# Patient Record
Sex: Male | Born: 1970 | Race: White | Hispanic: No | Marital: Married | Smoking: Current every day smoker
Health system: Southern US, Community
[De-identification: ages and names within clinical notes are randomized; demographics above are authoritative.]

## PROBLEM LIST (undated history)

## (undated) DIAGNOSIS — K559 Vascular disorder of intestine, unspecified: Secondary | ICD-10-CM

---

## 1998-12-11 ENCOUNTER — Encounter: Payer: Self-pay | Admitting: Emergency Medicine

## 1998-12-11 ENCOUNTER — Emergency Department (HOSPITAL_COMMUNITY): Admission: EM | Admit: 1998-12-11 | Discharge: 1998-12-11 | Payer: Self-pay | Admitting: Emergency Medicine

## 2001-04-30 ENCOUNTER — Encounter: Payer: Self-pay | Admitting: *Deleted

## 2001-04-30 ENCOUNTER — Encounter: Admission: RE | Admit: 2001-04-30 | Discharge: 2001-04-30 | Payer: Self-pay | Admitting: *Deleted

## 2001-05-04 ENCOUNTER — Encounter: Payer: Self-pay | Admitting: Emergency Medicine

## 2001-05-04 ENCOUNTER — Emergency Department (HOSPITAL_COMMUNITY): Admission: EM | Admit: 2001-05-04 | Discharge: 2001-05-04 | Payer: Self-pay | Admitting: Emergency Medicine

## 2001-06-02 ENCOUNTER — Encounter: Payer: Self-pay | Admitting: Emergency Medicine

## 2001-06-02 ENCOUNTER — Inpatient Hospital Stay (HOSPITAL_COMMUNITY): Admission: EM | Admit: 2001-06-02 | Discharge: 2001-06-05 | Payer: Self-pay | Admitting: Emergency Medicine

## 2001-06-16 ENCOUNTER — Encounter: Payer: Self-pay | Admitting: *Deleted

## 2001-06-16 ENCOUNTER — Encounter: Admission: RE | Admit: 2001-06-16 | Discharge: 2001-06-16 | Payer: Self-pay | Admitting: *Deleted

## 2001-07-29 ENCOUNTER — Ambulatory Visit (HOSPITAL_COMMUNITY): Admission: RE | Admit: 2001-07-29 | Discharge: 2001-07-29 | Payer: Self-pay | Admitting: Gastroenterology

## 2001-07-29 ENCOUNTER — Encounter (INDEPENDENT_AMBULATORY_CARE_PROVIDER_SITE_OTHER): Payer: Self-pay | Admitting: Specialist

## 2001-08-09 ENCOUNTER — Encounter: Payer: Self-pay | Admitting: *Deleted

## 2001-08-09 ENCOUNTER — Encounter: Admission: RE | Admit: 2001-08-09 | Discharge: 2001-08-09 | Payer: Self-pay | Admitting: *Deleted

## 2001-11-24 ENCOUNTER — Encounter: Admission: RE | Admit: 2001-11-24 | Discharge: 2001-11-24 | Payer: Self-pay | Admitting: Internal Medicine

## 2001-11-24 ENCOUNTER — Encounter: Payer: Self-pay | Admitting: Internal Medicine

## 2002-11-05 ENCOUNTER — Emergency Department (HOSPITAL_COMMUNITY): Admission: EM | Admit: 2002-11-05 | Discharge: 2002-11-05 | Payer: Self-pay | Admitting: Emergency Medicine

## 2003-07-03 ENCOUNTER — Encounter: Payer: Self-pay | Admitting: Internal Medicine

## 2003-07-03 ENCOUNTER — Encounter: Admission: RE | Admit: 2003-07-03 | Discharge: 2003-07-03 | Payer: Self-pay | Admitting: Internal Medicine

## 2004-04-10 ENCOUNTER — Emergency Department (HOSPITAL_COMMUNITY): Admission: EM | Admit: 2004-04-10 | Discharge: 2004-04-10 | Payer: Self-pay | Admitting: Emergency Medicine

## 2004-07-15 ENCOUNTER — Emergency Department (HOSPITAL_COMMUNITY): Admission: EM | Admit: 2004-07-15 | Discharge: 2004-07-16 | Payer: Self-pay | Admitting: Emergency Medicine

## 2005-05-24 ENCOUNTER — Emergency Department (HOSPITAL_COMMUNITY): Admission: EM | Admit: 2005-05-24 | Discharge: 2005-05-24 | Payer: Self-pay | Admitting: Emergency Medicine

## 2006-01-10 ENCOUNTER — Emergency Department (HOSPITAL_COMMUNITY): Admission: EM | Admit: 2006-01-10 | Discharge: 2006-01-10 | Payer: Self-pay | Admitting: Emergency Medicine

## 2007-01-16 ENCOUNTER — Emergency Department (HOSPITAL_COMMUNITY): Admission: EM | Admit: 2007-01-16 | Discharge: 2007-01-16 | Payer: Self-pay | Admitting: Emergency Medicine

## 2009-05-30 ENCOUNTER — Ambulatory Visit (HOSPITAL_BASED_OUTPATIENT_CLINIC_OR_DEPARTMENT_OTHER): Admission: RE | Admit: 2009-05-30 | Discharge: 2009-05-30 | Payer: Self-pay | Admitting: Orthopedic Surgery

## 2009-11-12 ENCOUNTER — Ambulatory Visit (HOSPITAL_BASED_OUTPATIENT_CLINIC_OR_DEPARTMENT_OTHER): Admission: RE | Admit: 2009-11-12 | Discharge: 2009-11-12 | Payer: Self-pay | Admitting: Orthopedic Surgery

## 2011-01-19 LAB — POCT HEMOGLOBIN-HEMACUE: Hemoglobin: 14.9 g/dL (ref 13.0–17.0)

## 2011-03-18 NOTE — Op Note (Signed)
Allen Larson, Allen Larson                 ACCOUNT NO.:  [id number]   MEDICAL RECORD NO.:  [id number]          PATIENT TYPE:  AMB   LOCATION:  DSC                          FACILITY:  [facility]   PHYSICIAN:  Feliberto Gottron. Turner Daniels, M.D.   DATE OF BIRTH:  05/19/1971   DATE OF PROCEDURE:  05/30/2009  DATE OF DISCHARGE:                               OPERATIVE REPORT   PREOPERATIVE DIAGNOSIS:  Right knee lateral meniscal tear.   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   PROCEDURE:  Right knee arthroscopic debridement of chondromalacia and  flap tears followed by microfracture of the posterior condylar region  that was grade 4 about 8 x 8 mm in size.   SURGEON:  Feliberto Gottron. Turner Daniels, MD   FIRST ASSISTANT:  None.   ANESTHETIC:  General LMA.   ESTIMATED BLOOD LOSS:  Minimal.   FLUID REPLACEMENT:  100 mL of crystalloid.   TOURNIQUET TIME:  None.   INDICATIONS FOR PROCEDURE:  A 40 year old gentleman who was bare for  water skiing, wiped out, and had a little bit of knee pain on the date  of the injury, but awoke yesterday morning with his knee locked and  unable to come to full extension.  His pain was more lateral than  medial.  He saw one of my partners Dr. Renae Fickle in the office who was  thought to have a displaced lateral meniscal tear and ultrasound  procedure was performed that was most consistent with a lateral meniscal  tear.  He had a 1 to 2+ effusion, had minimal pain relief with Xylocaine  injection and because his knee was locked, he is taken for arthroscopic  evaluation and treatment.  Risks and benefits of surgery were discussed  and questions were answered.   DESCRIPTION OF PROCEDURE:  The patient was identified by armband and  taken to operating room to [facility].  Appropriate  anesthetic monitors were attached and general LMA anesthesia induced  with  the patient in supine position.  Lateral post was applied to the  table and the right lower extremity was prepped and draped in the usual  sterile fashion from the ankle to the midthigh.  Time-out procedure  performed.  We began the operation itself by making standard  inferomedial and inferolateral peripatellar portals allowing  introduction of the arthroscope through the inferolateral portal and the  outflow through the inferomedial portal.  Pump pressure was kept between  70-90 mmHg and diagnostic arthroscopy revealed very mild grade 2 to 3  chondromalacia of the patella.  It was lightly debrided, moving into the  medial compartment distally and posteriorly along the notch region.  The  patient had fairly impressive flap tears of the articular cartilage that  was down to grade 4 and bare bone over an 8 x 8-mm area posteriorly.  The medial meniscus did have a small radial tear.  This was debrided and  this should be added to the postoperative diagnosis and  the procedure.  There was also noted that the ACL and PCL were intact.  The lateral  meniscus was in good condition and we were thoroughly probed from  posterior to lateral to anterior and the articular cartilage laterally  was in excellent condition.  The gutters were cleared medially and  laterally.  We directed our attention back to the posterior condyle of  the femur and I further debrided the area of grade 4 chondromalacia and  then brought in the microfracture picks and placed 6 punctate holes in  the bone in a fairly staggered pattern to hopefully bring a blood supply  to this area of bare bone chondromalacia.  The knee was irrigated out  with normal saline solution.  The arthroscopic instruments were removed  and a dressing of Xeroform, 4 x 4 dressing sponges, Webril, and Ace wrap  applied.  The patient was awakened and taken to the recovery room  without difficulty.      Feliberto Gottron. Turner Daniels, M.D.  Electronically Signed      FJR/MEDQ  D:  05/30/2009  T:  05/31/2009  Job:  [id number]

## 2011-03-21 NOTE — Consult Note (Signed)
[facility]  Patient:    Allen Larson, Allen Larson                        MRN: [id number] Proc. Date: 06/03/01 Adm. Date:  [id number] Attending:  Eliezer Champagne CC:         Wayne C. Dorna Bloom, M.D.   Consultation Report  HISTORY:  Patient seen at the request of Dr. Deniece Portela C. Wu for abdominal pain -- he has an essentially negative GI history, although an abdominal ultrasound was in the computer for abdominal pain a few years ago, which was negative -- who presents with a fairly acute onset of left upper quadrant abdominal pain. He went to the emergency room where CAT scan with stone protocol was done which did show two small kidney stones and possibly some inflammation in the descending colon and some sigmoid diverticula and he was admitted for further workup and plans.  His bowel movements have been normal without blood.  He has not had any previous other GI tests and has no urine complaints or blood in his urine.  According to Dr. Dorna Bloom, he has had some increased depression of late and had been started on Paxil recently.  PAST MEDICAL HISTORY:  Pertinent for panic attacks, shoulder surgery, some knee surgery and tonsillectomy.  FAMILY HISTORY:  Negative for any GI problems.  ALLERGIES:  No known allergies.  CURRENT MEDICINES:  Xanax and Paxil p.r.n.  SOCIAL HISTORY:  Denies any cocaine, speed, vitamins or herbs.  Will drink and does smoke.  REVIEW OF SYSTEMS:  Pertinent for he does not think the pain is any better but he has had no fever, chills or night sweats; no significant travel, at-risk meals or sick contacts.  PHYSICAL EXAMINATION:  GENERAL:  In no acute distress, lying comfortably in the bed.  VITAL SIGNS:  Stable.  Afebrile.  NECK:  Supple without obvious adenopathy.  LUNGS:  Clear.  HEART:  Regular rate and rhythm.  ABDOMEN:  He does have some left upper quadrant with guarding.  No rib cage tenderness.  No rebound tenderness.  Nontender elsewhere in the  abdomen.  EXTREMITIES:  Good peripheral pulses.  LABORATORY DATA:  Labs are pertinent for normal chemistries except for a total bilirubin of 1.4, not fractionated, normal other liver tests, BUN and creatinine.  Lipase normal.  CBC pertinent for being normal with a WBC of 9, which, after a day of antibiotics, has come down to 6.  CT reviewed with Dr. Ronaldo Miyamoto A. Young, pertinent for above-mentioned findings.  ASSESSMENT:  Probable diverticulitis.  PLAN:  Would treat for now for diverticulitis, change to p.o. antibiotics when better and can slowly advance his diet at that time.  If no better in one to two days or not responding, would repeat a CAT scan using contrast, since the first one was done without it.  May need to consider rectal contrast.  I have discussed the differential with the patient to include kidney stones, other infections, etc.  I discussed the risks, benefits and methods of colonoscopy with the patient and I probably would proceed as long as he responds appropriately for diverticulitis in two weeks as an outpatient, just to confirm the diagnosis.  We discussed proceeding too soon with too much inflammation increases the risks.  Might consider a GU consult if there is still a question of kidney stones causing his problems, which did show up on the CAT scan.  I will recheck tomorrow.  Thank  you much for the consultation and call me sooner p.r.n. DD:  06/03/01 TD:  06/03/01 Job: [id number]

## 2011-03-21 NOTE — Discharge Summary (Signed)
[facility]  Patient:    Allen Larson, Allen Larson                          MRN: [id number] Adm. Date:  06/02/01 Disc. Date: 06/05/01 Attending:  Yvette Rack. Dorna Bloom, M.D. CC:         Petra Kuba, M.D.   Discharge Summary  DISCHARGE CONDITION:  Stable.  DISCHARGE DIAGNOSES: 1. Left upper quadrant abdominal pain, likely diverticulitis. 2. Reflux disease. 3. Anxiety.  DISCHARGE MEDICATIONS: 1. Cipro 500 mg b.i.d. x 10 more days. 2. Flagyl 500 mg three times a day x 10 more days.  CONSULTS:  Petra Kuba, M.D., of gastroenterology.  FOLLOW-UP:  Follow up in approximately two weeks with Wayne C. Dorna Bloom, M.D., in the office, and also with Petra Kuba, M.D., for probable colonoscopy.  HOSPITAL COURSE:  Mr. Tith was admitted on June 02, 2001, after a one-day history of severe left flank and left lower quadrant abdominal pain.  He had initially a white count of 18,000, however, no shaking chills or fever. The patient had a CT scan done without contrast which showed two small kidney stones, but nonobstructing and inflamed left colon, which seemed to indicate diverticulitis.  The patient was begun on IV Cipro, Flagyl, IV fluids, and IV pain medicines.  The patient did improve during this time.  Petra Kuba, M.D., was consulted.  He will have a colonoscopy as an outpatient.  DISPOSITION:  The patient was discharged with the above medicines.  DISCHARGE CONDITION:  Stable. DD:  06/15/01 TD:  06/15/01 Job: [id number]

## 2011-03-21 NOTE — Procedures (Signed)
[facility]  Patient:    Allen Larson, Allen Larson Visit Number: [id number] MRN: [id number]          Service Type: END Location: ENDO Attending Physician:  Nelda Marseille Proc. Date: 07/29/01 Admit Date:  07/29/2001   CC:         Wayne C. Dorna Bloom, M.D.   Procedure Report  PROCEDURE:  Colonoscopy with biopsy.  INDICATIONS:  Patient with a bout of questionable diverticulitis.  Want to confirm the diagnosis.  INFORMED CONSENT:  Consent was signed after risk, benefits, methods and options were thoroughly discussed multiple times in the past.  MEDICINES USED:  Demerol 100 mg, Versed 10 mg.  DESCRIPTION OF PROCEDURE:  Rectal inspection was pertinent for external hemorrhoids.  Digital exam was negative.  The scope was inserted and easily advanced around the colon to the cecum.  This did not require any abdominal pressure or any position changes.  The cecum was identified by the appendiceal orifice and the ileocecal valve.  In fact, the scope was inserted a short ways into the terminal ileum which was normal.  Photodocumentation was obtained, but unfortunately the pictures did not print.  TI was normal, however.  The scope was slowly withdrawn.  The prep was adequate.  There was some liquid stool that required washing and suctioning.  On slow withdrawal through the colon, there were no signs of colitis, inflammation, ulceration, or even diverticula.  We slowly withdrew back to the distal sigmoid.  In the distal sigmoid, a tiny hyperplastic-appearing polyp was seen.  There was another one also seen in the rectum which was cold biopsied as well, so as the one in the sigmoid.  They were put in the same container.  The scope was retroflexed, revealing some internal hemorrhoids.  The scope was straightened and readvanced a short ways up the left side of the colon, air was suctioned and the scope removed.  The patient tolerated the procedure well, and there was  no obvious immediate complication.  ENDOSCOPIC DIAGNOSES: 1. Internal and external hemorrhoids. 2. Two tiny rectal and distal sigmoid polyps status post cold biopsied. 3. Otherwise within normal limits to the terminal ileum without signs of    diverticula or inflammation.  PLAN:  Await pathology to determine future colonic screening.  Follow up p.r.n. or in two months to recheck symptoms and make sure no further work-up plans are needed.  Happy to see back sooner p.r.n. Attending Physician:  Nelda Marseille DD:  07/29/01 TD:  07/29/01 Job: [id number]

## 2011-03-21 NOTE — Discharge Summary (Signed)
[facility]  Patient:    Allen Larson, Allen Larson                        MRN: [id number] Adm. Date:  [id number] Disc. Date: 06/05/01 Attending:  Eliezer Champagne CC:         Petra Kuba, M.D.   Discharge Summary  CONDITION ON DISCHARGE:  Good.  DISCHARGE MEDICATIONS: 1. Paxil CR 25 mg a day. 2. Cipro 500 mg b.i.d. x 10 days. 3. Flagyl 500 mg t.i.d. x 10 days. 4. Vicodin 5/500 1-2 every four to six hours p.r.n. pain. 5. Phenergan 25 mg 1 every four to six hours p.r.n. nausea or vomiting.  DIET:  To increase diet slowly.  Avoid seeds, nuts, raisins, corn, and popcorn.  FOLLOW-UP:  The patient is to see Dr. Ewing Schlein in two weeks and Dr. Dorna Bloom in one week.  DISCHARGE DIAGNOSES: 1. Left lower quadrant pain, likely diverticulitis. 2. Kidney stones, likely incidental. 3. Abdominal pain. 4. Depression. 5. Allergic rhinitis.  HISTORY OF PRESENT ILLNESS AND HOSPITAL COURSE:  Mr. Coppedge is a 40 year old white male with a history of mild depression who was admitted with two days of left lower quadrant abdominal pain.  No fever, diarrhea, or bowel changes.  No urinary symptoms.  CT scan initially on admission showed inflammatory edematous changes seen within the pericolic fat in addition to a segment of the descending colon.  Possibilities included diverticulitis and focal inflammatory colitis.  There was no evidence for hydronephrosis or ureteral calculi.  This was, however, was done without contrast.  There was also some sigmoid colonic diverticulosis seen.  Otherwise negative CT of the pelvis. The patient was initially begun on IV pain medicines including IV Phenergan and Dilaudid.  The patient never mounted an increased white count or fever.  He subsequently did improve during this time on IV Cipro and p.o. Flagyl.  Dr. Leary Roca of GI was consulted.  He felt this was likely a diverticulitis.  He thought that, in light of the acute changes, he would be best served with a  follow-up colonoscopy in two weeks to confirm diagnosis.  The patient did improve during this time and, hopefully, will be discharged on June 05, 2001, on p.o. antibiotics and pain medicines.  The patient is given the diet restrictions as above.  The patient will see Dr. Dorna Bloom in one week. DD:  06/04/01 TD:  06/05/01 Job: [id number]

## 2013-10-31 ENCOUNTER — Encounter (HOSPITAL_COMMUNITY): Payer: Self-pay | Admitting: Emergency Medicine

## 2013-10-31 ENCOUNTER — Emergency Department (HOSPITAL_COMMUNITY)
Admission: EM | Admit: 2013-10-31 | Discharge: 2013-11-01 | Disposition: A | Discharge status: Home / Self Care | Payer: BC Managed Care – PPO | Attending: Emergency Medicine | Admitting: Emergency Medicine

## 2013-10-31 DIAGNOSIS — Z8719 Personal history of other diseases of the digestive system: Secondary | ICD-10-CM | POA: Insufficient documentation

## 2013-10-31 DIAGNOSIS — R5381 Other malaise: Secondary | ICD-10-CM | POA: Insufficient documentation

## 2013-10-31 DIAGNOSIS — R52 Pain, unspecified: Secondary | ICD-10-CM | POA: Insufficient documentation

## 2013-10-31 DIAGNOSIS — R1084 Generalized abdominal pain: Secondary | ICD-10-CM | POA: Insufficient documentation

## 2013-10-31 DIAGNOSIS — R109 Unspecified abdominal pain: Secondary | ICD-10-CM

## 2013-10-31 DIAGNOSIS — F17200 Nicotine dependence, unspecified, uncomplicated: Secondary | ICD-10-CM | POA: Insufficient documentation

## 2013-10-31 HISTORY — DX: Vascular disorder of intestine, unspecified: K55.9

## 2013-10-31 LAB — COMPREHENSIVE METABOLIC PANEL
ALT: 26 U/L (ref 0–53)
Calcium: 9.2 mg/dL (ref 8.4–10.5)
Chloride: 102 mEq/L (ref 96–112)
Creatinine, Ser: 1.21 mg/dL (ref 0.50–1.35)
GFR calc non Af Amer: 73 mL/min — ABNORMAL LOW (ref >90)
Sodium: 139 mEq/L (ref 135–145)
Total Bilirubin: 0.4 mg/dL (ref 0.3–1.2)
Total Protein: 6.7 g/dL (ref 6.0–8.3)

## 2013-10-31 LAB — CBC WITH DIFFERENTIAL/PLATELET
Basophils Absolute: 0 10*3/uL (ref 0.0–0.1)
Basophils Relative: 0 % (ref 0–1)
HCT: 44.2 % (ref 39.0–52.0)
Hemoglobin: 15.3 g/dL (ref 13.0–17.0)
Lymphocytes Relative: 35 % (ref 12–46)
Lymphs Abs: 3.2 10*3/uL (ref 0.7–4.0)
MCH: 31.9 pg (ref 26.0–34.0)
Monocytes Absolute: 0.9 10*3/uL (ref 0.1–1.0)
Neutro Abs: 4.8 10*3/uL (ref 1.7–7.7)
Neutrophils Relative %: 53 % (ref 43–77)
RBC: 4.79 MIL/uL (ref 4.22–5.81)
RDW: 14.5 % (ref 11.5–15.5)

## 2013-10-31 MED ORDER — MORPHINE SULFATE 4 MG/ML IJ SOLN
4.0000 mg | Freq: Once | INTRAMUSCULAR | Status: AC
  Administered 2013-10-31: 4 mg via INTRAVENOUS
  Filled 2013-10-31: qty 1

## 2013-10-31 MED ORDER — SODIUM CHLORIDE 0.9 % IV BOLUS (SEPSIS)
1000.0000 mL | Freq: Once | INTRAVENOUS | Status: AC
  Administered 2013-10-31: 1000 mL via INTRAVENOUS

## 2013-10-31 MED ORDER — ONDANSETRON HCL 4 MG/2ML IJ SOLN
4.0000 mg | Freq: Once | INTRAMUSCULAR | Status: AC
  Administered 2013-10-31: 4 mg via INTRAVENOUS
  Filled 2013-10-31: qty 2

## 2013-10-31 NOTE — ED Notes (Signed)
Pt with history of ischemic colitis a year ago; same symptoms x 2 days; no bleeding

## 2013-11-01 ENCOUNTER — Emergency Department (HOSPITAL_COMMUNITY): Payer: BC Managed Care – PPO

## 2013-11-01 ENCOUNTER — Encounter (HOSPITAL_COMMUNITY): Payer: Self-pay

## 2013-11-01 LAB — URINALYSIS, ROUTINE W REFLEX MICROSCOPIC
Specific Gravity, Urine: 1.027 (ref 1.005–1.030)
pH: 6 (ref 5.0–8.0)

## 2013-11-01 MED ORDER — HYDROMORPHONE HCL PF 1 MG/ML IJ SOLN
0.5000 mg | Freq: Once | INTRAMUSCULAR | Status: AC
  Administered 2013-11-01: 0.5 mg via INTRAVENOUS

## 2013-11-01 MED ORDER — ONDANSETRON 4 MG PO TBDP: ORAL_TABLET | ORAL | Status: AC

## 2013-11-01 MED ORDER — HYDROMORPHONE HCL PF 1 MG/ML IJ SOLN
1.0000 mg | Freq: Once | INTRAMUSCULAR | Status: AC
  Administered 2013-11-01: 1 mg via INTRAVENOUS
  Filled 2013-11-01: qty 1

## 2013-11-01 MED ORDER — IOHEXOL 300 MG/ML  SOLN
50.0000 mL | Freq: Once | INTRAMUSCULAR | Status: AC | PRN
  Administered 2013-11-01: 50 mL via ORAL

## 2013-11-01 MED ORDER — HYDROMORPHONE HCL PF 2 MG/ML IJ SOLN
INTRAMUSCULAR | Status: AC
  Filled 2013-11-01: qty 1

## 2013-11-01 MED ORDER — SODIUM CHLORIDE 0.9 % IV BOLUS (SEPSIS)
1000.0000 mL | Freq: Once | INTRAVENOUS | Status: AC
  Administered 2013-11-01: 1000 mL via INTRAVENOUS

## 2013-11-01 MED ORDER — IOHEXOL 300 MG/ML  SOLN
100.0000 mL | Freq: Once | INTRAMUSCULAR | Status: AC | PRN
  Administered 2013-11-01: 100 mL via INTRAVENOUS

## 2013-11-01 MED ORDER — OXYCODONE-ACETAMINOPHEN 5-325 MG PO TABS: 1.0000 | ORAL_TABLET | Freq: Four times a day (QID) | ORAL | Status: DC | PRN

## 2013-11-01 NOTE — ED Provider Notes (Signed)
CSN: [id number]     Arrival date & time 10/31/13  1843 History   First MD Initiated Contact with Patient 10/31/13 2245     Chief Complaint  Patient presents with  . Abdominal Pain   (Consider location/radiation/quality/duration/timing/severity/associated sxs/prior Treatment) HPI 42 yo male presents with gradual onset of abdominal pain over the past 2 days. Patient states it feels like the same presentation as before when he was diagnosed with ischemic colitis. Patient states pain is constant at a 7/10 with intermittent sharp shooting pains rated at 9/10 pain is diffuse but worse in the lower abdomen. Pain is gradually worsening. Pain exacerbated with food, approximately 1-2 hours after eating. Patient appetite has been poor. Denies Fever/Chills. No chest pain or dyspnea. No N/V/D. Patient does admit to hard ball-like stools. Denies bloody stools or hematuria. Denies alcohol use or drug use. No hx of abdominal surgeries. Denies any medical conditions.  Past Medical History  Diagnosis Date  . Ischemic colitis    History reviewed. No pertinent past surgical history. No family history on file. History  Substance Use Topics  . Smoking status: Current Every Day Smoker -- 1.00 packs/day    Types: Cigarettes  . Smokeless tobacco: Not on file  . Alcohol Use: Not on file    Review of Systems  Constitutional: Positive for fatigue.  Gastrointestinal: Positive for abdominal pain. Negative for abdominal distention and rectal pain.  Genitourinary: Negative for dysuria, difficulty urinating and testicular pain.  Neurological: Positive for weakness.  All other systems reviewed and are negative.    Allergies  Review of patient's allergies indicates no known allergies.  Home Medications   Current Outpatient Rx  Name  Route  Sig  Dispense  Refill  . Multiple Vitamin (MULTIVITAMIN WITH MINERALS) TABS tablet   Oral   Take 1 tablet by mouth daily.         . ondansetron (ZOFRAN ODT) 4 MG  disintegrating tablet      4mg  ODT q4 hours prn nausea/vomit   15 tablet   0   . oxyCODONE-acetaminophen (PERCOCET) 5-325 MG per tablet   Oral   Take 1-2 tablets by mouth every 6 (six) hours as needed for moderate pain or severe pain.   20 tablet   0    BP 124/66  Pulse 61  Temp(Src) 97.8 F (36.6 C) (Oral)  Resp 16  Ht 5\' 9"  (1.753 m)  Wt 180 lb (81.647 kg)  BMI 26.57 kg/m2  SpO2 98% Physical Exam  Nursing note and vitals reviewed. Constitutional: He is oriented to person, place, and time. He appears well-developed and well-nourished. No distress.  HENT:  Head: Normocephalic and atraumatic.  Cardiovascular: Normal rate and regular rhythm.  Exam reveals no gallop and no friction rub.   No murmur heard. Pulmonary/Chest: Effort normal and breath sounds normal. No respiratory distress. He has no wheezes. He has no rales.  Abdominal: Soft. Normal appearance and bowel sounds are normal. He exhibits no distension, no fluid wave, no abdominal bruit, no ascites, no pulsatile midline mass and no mass. There is no hepatosplenomegaly. There is generalized tenderness. There is guarding and CVA tenderness (Right ). There is no rigidity, no rebound, no tenderness at McBurney's point and negative Murphy's sign.  Musculoskeletal: Normal range of motion. He exhibits no edema.  Neurological: He is alert and oriented to person, place, and time.  Skin: Skin is warm and dry. He is not diaphoretic.  Psychiatric: He has a normal mood and affect. His behavior is  normal.    ED Course  Procedures (including critical care time) Labs Review Labs Reviewed  COMPREHENSIVE METABOLIC PANEL - Abnormal; Notable for the following:    GFR calc non Af Amer 73 (*)    GFR calc Af Amer 85 (*)    All other components within normal limits  URINALYSIS, ROUTINE W REFLEX MICROSCOPIC - Abnormal; Notable for the following:    APPearance CLOUDY (*)    All other components within normal limits  CBC WITH DIFFERENTIAL    Imaging Review Ct Abdomen Pelvis W Contrast  11/01/2013   CLINICAL DATA:  Right abdominal pain, history of colitis  EXAM: CT ABDOMEN AND PELVIS WITH CONTRAST  TECHNIQUE: Multidetector CT imaging of the abdomen and pelvis was performed using the standard protocol following bolus administration of intravenous contrast.  CONTRAST:  OMNIPAQUE IOHEXOL 300 MG/ML  SOLN  COMPARISON:  04/10/2004  FINDINGS: There is bibasilar atelectasis. There is a subpleural left lower lobe pulmonary nodule measuring 4 mm.  The liver demonstrates no focal abnormality. There is no intrahepatic or extrahepatic biliary ductal dilatation. The gallbladder is normal. The spleen demonstrates no focal abnormality. There is a punctate nonobstructing left renal calculus. The right kidney, adrenal glands and pancreas are normal. The bladder is unremarkable.  The stomach, duodenum, small intestine, and large intestine demonstrate no contrast extravasation or dilatation. There is a normal caliber appendix in the right lower quadrant without periappendiceal inflammatory changes. There is no pneumoperitoneum, pneumatosis, or portal venous gas. There is no abdominal or pelvic free fluid. There is no lymphadenopathy.  The abdominal aorta is normal in caliber with atherosclerosis.  There are no lytic or sclerotic osseous lesions.  IMPRESSION: 1.  No acute abdominal or pelvic pathology.  2.  Normal appendix.  3.  Punctate nonobstructing left renal calculus.  4. Nonspecific, 4 mm subpleural left lower lobe pulmonary nodule. Given risk factors for bronchogenic carcinoma, follow-up chest CT at 1 year is recommended. This recommendation follows the consensus statement: Guidelines for Management of Small Pulmonary Nodules Detected on CT Scans: A Statement from the Fleischner Society as published in Radiology 2005; 237:395-400.   Electronically Signed   By: Elige Ko   On: 11/01/2013 01:13    EKG Interpretation   None       MDM   1.  Abdominal pain, acute, generalized   2. Right flank pain    UA negative. Labs WNL. CT shows no acute abdominal/pelvic pathology. Normal appendix, gall bladder, pancreas. Nonspecific 4mm subpleural left lower lobe pulmonary nodule, recommend follow up chest CT in 1 yr.   Patient's pain improved with tx. Patient states he would like to leave because he has a GI appointment in 10 hours. Patient was told by GI doctor to come to ED if sxs worsened before appointment. Pain controlled in ED. Patient's vitals are stable and he appears to be in good condition. Will treat patient's pain until he can be seen by GI.   Advised follow up with your GI doctor in [place] tomorrow as scheduled. Take pain medication as directed. Start with 1 tablet, may increase to 2 tablets if needed. If pain becomes unmanageable or symptoms should worsen please return to ED. Patient agrees with plan. Discharged in good condition.   Meds given in ED:  Medications  sodium chloride 0.9 % bolus 1,000 mL (0 mLs Intravenous Stopped 11/01/13 0036)  morphine 4 MG/ML injection 4 mg (4 mg Intravenous Given 10/31/13 2341)  ondansetron (ZOFRAN) injection 4 mg (4 mg  Intravenous Given 10/31/13 2341)  iohexol (OMNIPAQUE) 300 MG/ML solution 50 mL (50 mLs Oral Contrast Given 11/01/13 0006)  iohexol (OMNIPAQUE) 300 MG/ML solution 100 mL (100 mLs Intravenous Contrast Given 11/01/13 0035)  HYDROmorphone (DILAUDID) injection 0.5 mg (0.5 mg Intravenous Given 11/01/13 0104)  sodium chloride 0.9 % bolus 1,000 mL (0 mLs Intravenous Stopped 11/01/13 0319)  HYDROmorphone (DILAUDID) injection 1 mg (1 mg Intravenous Given 11/01/13 0158)    Discharge Medication List as of 11/01/2013  3:23 AM    START taking these medications   Details  ondansetron (ZOFRAN ODT) 4 MG disintegrating tablet 4mg  ODT q4 hours prn nausea/vomit, Print    oxyCODONE-acetaminophen (PERCOCET) 5-325 MG per tablet Take 1-2 tablets by mouth every 6 (six) hours as needed for  moderate pain or severe pain., Starting 11/01/2013, Until Discontinued, Print         Allen Norris [place], [place] 11/01/13 2337

## 2013-11-03 NOTE — ED Provider Notes (Signed)
Medical screening examination/treatment/procedure(s) were performed by non-physician practitioner and as supervising physician I was immediately available for consultation/collaboration.    Pegge Cumberledge, MD 11/03/13 0952 

## 2013-11-24 ENCOUNTER — Encounter: Payer: Self-pay | Admitting: Emergency Medicine

## 2013-11-24 ENCOUNTER — Ambulatory Visit (INDEPENDENT_AMBULATORY_CARE_PROVIDER_SITE_OTHER): Payer: BC Managed Care – PPO | Admitting: Emergency Medicine

## 2013-11-24 VITALS — BP 128/84 | HR 78 | Ht 69.0 in | Wt 184.8 lb

## 2013-11-24 DIAGNOSIS — F17200 Nicotine dependence, unspecified, uncomplicated: Secondary | ICD-10-CM

## 2013-11-24 DIAGNOSIS — R911 Solitary pulmonary nodule: Secondary | ICD-10-CM | POA: Insufficient documentation

## 2013-11-24 NOTE — Patient Instructions (Signed)
We will perform a CT scan chest now Dr Delton CoombesByrum will call you with the results and to discuss our plans We will perform repeat Ct scan and plan to follow up in the office in January 2016

## 2013-11-24 NOTE — Assessment & Plan Note (Signed)
Discussed cessation today. 

## 2013-11-24 NOTE — Progress Notes (Signed)
Subjective:    Patient ID: Allen Larson, male    DOB: November 16, 1970, 43 y.o.   MRN: [id number]  HPI 43 yo smoker (22 pk-yrs), hx ischemic colitis and diverticuli. He has undergone CT abdomen/pel in 2005 and again on 11/01/13. These studies showed a LLL subpleural nodule, was 3mm on original CT, measured as 4mm on the 2014 CT. He has family hx colon CA. He feels well.    Review of Systems  Constitutional: Negative for fever and unexpected weight change.  HENT: Positive for congestion, postnasal drip and sinus pressure. Negative for dental problem, ear pain, nosebleeds, rhinorrhea, sneezing, sore throat and trouble swallowing.   Eyes: Negative for redness and itching.  Respiratory: Positive for cough. Negative for chest tightness, shortness of breath and wheezing.   Cardiovascular: Negative for palpitations and leg swelling.  Gastrointestinal: Negative for nausea and vomiting.  Genitourinary: Negative for dysuria.  Musculoskeletal: Negative for joint swelling.  Skin: Negative for rash.  Neurological: Negative for headaches.  Hematological: Does not bruise/bleed easily.  Psychiatric/Behavioral: Negative for dysphoric mood. The patient is not nervous/anxious.     Past Medical History  Diagnosis Date  . Ischemic colitis      Family History  Problem Relation Age of Onset  . Cancer - Colon Father   . Heart attack Maternal Grandmother      History   Social History  . Marital Status: Married    Spouse Name: N/A    Number of Children: N/A  . Years of Education: N/A   Occupational History  . Not on file.   Social History Main Topics  . Smoking status: Current Every Day Smoker -- 1.00 packs/day for 22 years    Types: Cigarettes  . Smokeless tobacco: Not on file  . Alcohol Use: Yes     Comment: social  . Drug Use: Yes  . Sexual Activity: Not on file   Other Topics Concern  . Not on file   Social History Narrative  . No narrative on file     No Known Allergies    Outpatient Prescriptions Prior to Visit  Medication Sig Dispense Refill  . Multiple Vitamin (MULTIVITAMIN WITH MINERALS) TABS tablet Take 1 tablet by mouth daily.      . ondansetron (ZOFRAN ODT) 4 MG disintegrating tablet 4mg  ODT q4 hours prn nausea/vomit  15 tablet  0  . oxyCODONE-acetaminophen (PERCOCET) 5-325 MG per tablet Take 1-2 tablets by mouth every 6 (six) hours as needed for moderate pain or severe pain.  20 tablet  0   No facility-administered medications prior to visit.   Has lived in [place], worked at one time in a [employer]rop[employer]. Now works in [employer].       Objective:   Physical Exam Filed Vitals:   11/24/13 0940  BP: 128/84  Pulse: 78  Height: 5\' 9"  (1.753 m)  Weight: 184 lb 12.8 oz (83.825 kg)  SpO2: 97%   Gen: Pleasant, well-nourished, in no distress,  normal affect  ENT: No lesions,  mouth clear,  oropharynx clear, no postnasal drip  Neck: No JVD, no TMG, no carotid bruits  Lungs: No use of accessory muscles, no dullness to percussion, clear without rales or rhonchi  Cardiovascular: RRR, heart sounds normal, no murmur or gallops, no peripheral edema  Musculoskeletal: No deformities, no cyanosis or clubbing  Neuro: alert, non focal  Skin: Warm, no lesions or rashes     Assessment & Plan:  Pulmonary nodule CT scan chest now, I will  call him with the results  Tobacco use disorder Discussed cessation today

## 2013-11-24 NOTE — Assessment & Plan Note (Signed)
CT scan chest now, I will call him with the results

## 2013-11-30 ENCOUNTER — Ambulatory Visit (INDEPENDENT_AMBULATORY_CARE_PROVIDER_SITE_OTHER)
Admission: RE | Admit: 2013-11-30 | Discharge: 2013-11-30 | Disposition: A | Discharge status: Home / Self Care | Payer: BC Managed Care – PPO | Source: Ambulatory Visit | Attending: Emergency Medicine | Admitting: Emergency Medicine

## 2013-11-30 DIAGNOSIS — R911 Solitary pulmonary nodule: Secondary | ICD-10-CM

## 2013-12-14 ENCOUNTER — Telehealth: Payer: Self-pay | Admitting: Emergency Medicine

## 2013-12-14 NOTE — Telephone Encounter (Signed)
Last ov 1.22.15 w/ RB CT Chest done 1.28.15 Called spoke with pt's spouse, advised that RB was out of the office today but will return tomorrow.  Pt spouse okay with this and okay will call back tomorrow.  Will route to RB marked urgent and [facility]  Dr Delton CoombesByrum please advise, thank you. IMPRESSION:  Three probable subpleural pulmonary lymph nodes, measuring up to 4  mm, as described above. At least three are stable since 2005,  benign. One 3 mm nodule in the left upper lobe was not imaged on  prior studies, but remains likely benign.  If clinically warranted, a single follow-up CT chest can be  performed in 12 months. This recommendation follows the consensus  statement: Guidelines for Management of Small Pulmonary Nodules  Detected on CT Scans: A Statement from the Fleischner Society as  published in Radiology 2005; 237:395-400.  Suspected minimal coronary atherosclerosis in the distal left main  coronary artery, age advanced.

## 2013-12-16 NOTE — Telephone Encounter (Signed)
Dr. Sherene SiresWert in RB absence can you provide any results for the pt? Please advise. Carron CurieJennifer Castillo, CMA

## 2013-12-16 NOTE — Telephone Encounter (Signed)
Let pt know ct looks really good (no change means no growth and likely benign) and Dr Delton CoombesByrum will let them know whether any further f/u needed at all once he reviews

## 2013-12-16 NOTE — Telephone Encounter (Signed)
I called and spoke with spouse. Aware of recs. Nothing further needed. Will forward to RB as well

## 2013-12-19 NOTE — Telephone Encounter (Signed)
Thank you :)

## 2014-08-01 ENCOUNTER — Telehealth: Payer: Self-pay | Admitting: Emergency Medicine

## 2014-08-01 DIAGNOSIS — R911 Solitary pulmonary nodule: Secondary | ICD-10-CM

## 2014-08-01 NOTE — Telephone Encounter (Signed)
Please notify pt that he needs a final CT scan of the chest in January 2016 and then f/u with me to review. I ordered the scan

## 2014-12-01 ENCOUNTER — Ambulatory Visit (INDEPENDENT_AMBULATORY_CARE_PROVIDER_SITE_OTHER)
Admission: RE | Admit: 2014-12-01 | Discharge: 2014-12-01 | Disposition: A | Discharge status: Home / Self Care | Payer: BLUE CROSS/BLUE SHIELD | Source: Ambulatory Visit | Attending: Emergency Medicine | Admitting: Emergency Medicine

## 2014-12-01 DIAGNOSIS — R911 Solitary pulmonary nodule: Secondary | ICD-10-CM

## 2014-12-13 ENCOUNTER — Encounter (HOSPITAL_COMMUNITY): Payer: Self-pay | Admitting: Emergency Medicine

## 2014-12-13 ENCOUNTER — Emergency Department (HOSPITAL_COMMUNITY)
Admission: EM | Admit: 2014-12-13 | Discharge: 2014-12-14 | Disposition: A | Discharge status: Home / Self Care | Payer: BLUE CROSS/BLUE SHIELD | Attending: Emergency Medicine | Admitting: Emergency Medicine

## 2014-12-13 ENCOUNTER — Emergency Department (HOSPITAL_COMMUNITY): Payer: BLUE CROSS/BLUE SHIELD

## 2014-12-13 DIAGNOSIS — R202 Paresthesia of skin: Secondary | ICD-10-CM | POA: Diagnosis present

## 2014-12-13 DIAGNOSIS — M545 Low back pain, unspecified: Secondary | ICD-10-CM

## 2014-12-13 DIAGNOSIS — R42 Dizziness and giddiness: Secondary | ICD-10-CM | POA: Diagnosis not present

## 2014-12-13 DIAGNOSIS — Z7982 Long term (current) use of aspirin: Secondary | ICD-10-CM | POA: Diagnosis not present

## 2014-12-13 DIAGNOSIS — Z720 Tobacco use: Secondary | ICD-10-CM | POA: Diagnosis not present

## 2014-12-13 DIAGNOSIS — R1032 Left lower quadrant pain: Secondary | ICD-10-CM | POA: Insufficient documentation

## 2014-12-13 DIAGNOSIS — Z792 Long term (current) use of antibiotics: Secondary | ICD-10-CM | POA: Diagnosis not present

## 2014-12-13 DIAGNOSIS — R0602 Shortness of breath: Secondary | ICD-10-CM | POA: Insufficient documentation

## 2014-12-13 DIAGNOSIS — Z8719 Personal history of other diseases of the digestive system: Secondary | ICD-10-CM | POA: Insufficient documentation

## 2014-12-13 LAB — COMPREHENSIVE METABOLIC PANEL
ALK PHOS: 43 U/L (ref 39–117)
ALT: 30 U/L (ref 0–53)
ANION GAP: 5 (ref 5–15)
AST: 46 U/L — ABNORMAL HIGH (ref 0–37)
Albumin: 3.7 g/dL (ref 3.5–5.2)
BUN: 13 mg/dL (ref 6–23)
CHLORIDE: 103 mmol/L (ref 96–112)
CO2: 28 mmol/L (ref 19–32)
Calcium: 8.8 mg/dL (ref 8.4–10.5)
Creatinine, Ser: 1.45 mg/dL — ABNORMAL HIGH (ref 0.50–1.35)
GFR, EST AFRICAN AMERICAN: 67 mL/min — AB (ref >90)
GFR, EST NON AFRICAN AMERICAN: 58 mL/min — AB (ref >90)
Glucose, Bld: 117 mg/dL — ABNORMAL HIGH (ref 70–99)
POTASSIUM: 3.6 mmol/L (ref 3.5–5.1)
Sodium: 136 mmol/L (ref 135–145)
Total Bilirubin: 0.7 mg/dL (ref 0.3–1.2)
Total Protein: 6.3 g/dL (ref 6.0–8.3)

## 2014-12-13 LAB — CBC WITH DIFFERENTIAL/PLATELET
Basophils Absolute: 0 10*3/uL (ref 0.0–0.1)
Basophils Relative: 0 % (ref 0–1)
EOS PCT: 2 % (ref 0–5)
Eosinophils Absolute: 0.3 10*3/uL (ref 0.0–0.7)
HEMATOCRIT: 44.6 % (ref 39.0–52.0)
Hemoglobin: 15.9 g/dL (ref 13.0–17.0)
LYMPHS ABS: 2.1 10*3/uL (ref 0.7–4.0)
LYMPHS PCT: 15 % (ref 12–46)
MCH: 32.9 pg (ref 26.0–34.0)
MCHC: 35.7 g/dL (ref 30.0–36.0)
MCV: 92.1 fL (ref 78.0–100.0)
MONO ABS: 1.7 10*3/uL — AB (ref 0.1–1.0)
MONOS PCT: 12 % (ref 3–12)
Neutro Abs: 9.8 10*3/uL — ABNORMAL HIGH (ref 1.7–7.7)
Neutrophils Relative %: 71 % (ref 43–77)
Platelets: 297 10*3/uL (ref 150–400)
RBC: 4.84 MIL/uL (ref 4.22–5.81)
RDW: 14.9 % (ref 11.5–15.5)
WBC: 13.8 10*3/uL — ABNORMAL HIGH (ref 4.0–10.5)

## 2014-12-13 LAB — URINALYSIS, ROUTINE W REFLEX MICROSCOPIC
BILIRUBIN URINE: NEGATIVE
Glucose, UA: NEGATIVE mg/dL
Hgb urine dipstick: NEGATIVE
Ketones, ur: NEGATIVE mg/dL
LEUKOCYTES UA: NEGATIVE
NITRITE: NEGATIVE
PH: 6 (ref 5.0–8.0)
Protein, ur: NEGATIVE mg/dL
SPECIFIC GRAVITY, URINE: 1.007 (ref 1.005–1.030)
Urobilinogen, UA: 0.2 mg/dL (ref 0.0–1.0)

## 2014-12-13 MED ORDER — KETOROLAC TROMETHAMINE 30 MG/ML IJ SOLN
30.0000 mg | Freq: Once | INTRAMUSCULAR | Status: AC
  Administered 2014-12-13: 30 mg via INTRAVENOUS
  Filled 2014-12-13: qty 1

## 2014-12-13 MED ORDER — LORAZEPAM 2 MG/ML IJ SOLN
0.5000 mg | Freq: Once | INTRAMUSCULAR | Status: AC
  Administered 2014-12-13: 0.5 mg via INTRAVENOUS
  Filled 2014-12-13: qty 1

## 2014-12-13 MED ORDER — LORAZEPAM 2 MG/ML IJ SOLN
1.0000 mg | Freq: Once | INTRAMUSCULAR | Status: AC
  Administered 2014-12-13: 1 mg via INTRAVENOUS
  Filled 2014-12-13: qty 1

## 2014-12-13 MED ORDER — IOHEXOL 300 MG/ML  SOLN: 25.0000 mL | Freq: Once | INTRAMUSCULAR | Status: AC | PRN

## 2014-12-13 MED ORDER — SODIUM CHLORIDE 0.9 % IV BOLUS (SEPSIS)
1000.0000 mL | Freq: Once | INTRAVENOUS | Status: AC
  Administered 2014-12-13: 1000 mL via INTRAVENOUS

## 2014-12-13 MED ORDER — MORPHINE SULFATE 4 MG/ML IJ SOLN
8.0000 mg | Freq: Once | INTRAMUSCULAR | Status: AC
Start: 2014-12-13 — End: 2014-12-13
  Administered 2014-12-13: 8 mg via INTRAVENOUS
  Filled 2014-12-13: qty 2

## 2014-12-13 NOTE — ED Notes (Signed)
Pt c/o severe anxiety about new symptoms of hands being tingly.

## 2014-12-13 NOTE — ED Notes (Signed)
Pt c/o increased low back pain and anxiety. Pt visibly anxious. MD aware. Pain medication ordered

## 2014-12-13 NOTE — ED Notes (Signed)
Patient transported to X-ray 

## 2014-12-13 NOTE — ED Notes (Signed)
Pt. reports bilateral hands tingling onset this evening  after work out ( exercise ) with  body aches . Equal grips with no arm drift , speech clear with no facial asymmetry , respirations unlabored .

## 2014-12-14 ENCOUNTER — Emergency Department (HOSPITAL_COMMUNITY): Payer: BLUE CROSS/BLUE SHIELD

## 2014-12-14 ENCOUNTER — Encounter (HOSPITAL_COMMUNITY): Payer: Self-pay | Admitting: Radiology

## 2014-12-14 MED ORDER — HYDROMORPHONE HCL 1 MG/ML IJ SOLN
INTRAMUSCULAR | Status: AC
  Filled 2014-12-14: qty 1

## 2014-12-14 MED ORDER — OXYCODONE-ACETAMINOPHEN 5-325 MG PO TABS: 1.0000 | ORAL_TABLET | ORAL | Status: AC | PRN

## 2014-12-14 MED ORDER — MORPHINE SULFATE 4 MG/ML IJ SOLN
6.0000 mg | Freq: Once | INTRAMUSCULAR | Status: AC
  Administered 2014-12-14: 6 mg via INTRAVENOUS
  Filled 2014-12-14: qty 2

## 2014-12-14 MED ORDER — NAPROXEN 500 MG PO TABS: 500.0000 mg | ORAL_TABLET | Freq: Two times a day (BID) | ORAL | Status: AC

## 2014-12-14 MED ORDER — SODIUM CHLORIDE 0.9 % IV SOLN
Freq: Once | INTRAVENOUS | Status: AC
  Administered 2014-12-14: 01:00:00 via INTRAVENOUS

## 2014-12-14 MED ORDER — ORPHENADRINE CITRATE ER 100 MG PO TB12: 100.0000 mg | ORAL_TABLET | Freq: Two times a day (BID) | ORAL | Status: AC

## 2014-12-14 MED ORDER — IOHEXOL 300 MG/ML  SOLN
100.0000 mL | Freq: Once | INTRAMUSCULAR | Status: AC | PRN
  Administered 2014-12-14: 100 mL via INTRAVENOUS

## 2014-12-14 MED ORDER — HYDROMORPHONE HCL 1 MG/ML IJ SOLN
1.0000 mg | Freq: Once | INTRAMUSCULAR | Status: AC
Start: 2014-12-14 — End: 2014-12-14
  Administered 2014-12-14: 1 mg via INTRAVENOUS

## 2014-12-14 NOTE — ED Provider Notes (Signed)
CSN: [id number]     Arrival date & time 12/13/14  2039 History   First MD Initiated Contact with Patient 12/13/14 2104     Chief Complaint  Patient presents with  . Tingling      HPI Patient presents to the emergency department after he felt suddenly lightheaded and short of breath and began having tingling in his bilateral hands and fingers.  He reports his hands became discolored.  His hands and now returned to normal color.  He also developed lower abdominal pain and low back pain at this time.  He denies preceding palpitations.  No syncope.  His never had symptoms like this before.  He does admit that his symptoms have made him somewhat anxious he feels anxious at this time.  He reports moderate left lower quadrant abdominal pain.  He denies urinary frequency or dysuria.  He does have some left flank pain as well.  No history of kidney stones.  He reports she's felt more tired over the past several days he has taken more rests in the afternoon.  At this time his pain is mild to moderate in severity.  No chest pain at this time.  No shortness of breath.   Past Medical History  Diagnosis Date  . Ischemic colitis    History reviewed. No pertinent past surgical history. Family History  Problem Relation Age of Onset  . Cancer - Colon Father   . Heart attack Maternal Grandmother    History  Substance Use Topics  . Smoking status: Current Every Day Smoker -- 1.00 packs/day for 22 years    Types: Cigarettes  . Smokeless tobacco: Not on file  . Alcohol Use: Yes     Comment: social    Review of Systems  All other systems reviewed and are negative.     Allergies  Review of patient's allergies indicates no known allergies.  Home Medications   Prior to Admission medications   Medication Sig Start Date End Date Taking? Authorizing Provider  aspirin 325 MG tablet Take 650 mg by mouth once.   Yes Historical Provider, MD  doxycycline (VIBRAMYCIN) 100 MG capsule Take 100 mg by mouth 2  (two) times daily. Started 12/05/14, for 10 days ending 12/14/14   Yes Historical Provider, MD  ibuprofen (ADVIL,MOTRIN) 800 MG tablet Take 800 mg by mouth every 8 (eight) hours as needed. for pain 12/05/14  Yes Historical Provider, MD  ondansetron (ZOFRAN ODT) 4 MG disintegrating tablet 4mg  ODT q4 hours prn nausea/vomit 11/01/13   Allen NorrisJacob Gray Lackey, PA-C   BP 128/62 mmHg  Pulse 83  Temp(Src) 97.9 F (36.6 C) (Oral)  Resp 16  Ht 5\' 7"  (1.702 m)  Wt 177 lb (80.287 kg)  BMI 27.72 kg/m2  SpO2 98% Physical Exam  Constitutional: He is oriented to person, place, and time. He appears well-developed and well-nourished.  HENT:  Head: Normocephalic and atraumatic.  Eyes: EOM are normal.  Neck: Normal range of motion.  Cardiovascular: Normal rate, regular rhythm, normal heart sounds and intact distal pulses.   Pulmonary/Chest: Effort normal and breath sounds normal. No respiratory distress.  Abdominal: Soft. He exhibits no distension. There is no tenderness.  Musculoskeletal: Normal range of motion.  Neurological: He is alert and oriented to person, place, and time.  Skin: Skin is warm and dry.  Psychiatric: He has a normal mood and affect. Judgment normal.  Nursing note and vitals reviewed.   ED Course  Procedures (including critical care time) Labs Review Labs Reviewed  CBC WITH DIFFERENTIAL/PLATELET - Abnormal; Notable for the following:    WBC 13.8 (*)    Neutro Abs 9.8 (*)    Monocytes Absolute 1.7 (*)    All other components within normal limits  COMPREHENSIVE METABOLIC PANEL - Abnormal; Notable for the following:    Glucose, Bld 117 (*)    Creatinine, Ser 1.45 (*)    AST 46 (*)    GFR calc non Af Amer 58 (*)    GFR calc Af Amer 67 (*)    All other components within normal limits  URINALYSIS, ROUTINE W REFLEX MICROSCOPIC    Imaging Review Dg Chest 2 View  12/13/2014   CLINICAL DATA:  Nervous feeling. Shaking. Shortness of breath. Weakness and tingling after working out this  evening.  EXAM: CHEST  2 VIEW  COMPARISON:  CT chest 12/01/2014  FINDINGS: The heart size and mediastinal contours are within normal limits. Both lungs are clear. The visualized skeletal structures are unremarkable.  IMPRESSION: No active cardiopulmonary disease.   Electronically Signed   By: Burman Nieves M.D.   On: 12/13/2014 22:44  I personally reviewed the imaging tests through PACS system I reviewed available ER/hospitalization records through the EMR    EKG Interpretation   Date/Time:  Wednesday December 13 2014 20:43:11 EST Ventricular Rate:  101 PR Interval:  152 QRS Duration: 80 QT Interval:  310 QTC Calculation: 401 R Axis:   75 Text Interpretation:  Sinus tachycardia Nonspecific T wave abnormality  Abnormal ECG No significant change was found Confirmed by Rukiya Hodgkins  MD,  Caryn Bee ([id number]) on 12/13/2014 10:07:07 PM      MDM   Final diagnoses:  None    Patient is overall well-appearing.  He continues to have some discomfort and pain at this time.  He is just received additional pain medication.  CT scan of his abdomen pelvis will be performed.  He could represent left-sided ureteral stone although he doesn't have hematuria.  If the patient's CT scan is normal I believe he can be discharged home safely with a short course of pain medication.  He does not have a primary care physician although he states that he will get one.  In the meantime I've asked that he return the emergency department for any new or worsening symptoms.  CT scan pending at this time.  Care to Dr. Theressa Millard, MD 12/14/14 [phone]

## 2014-12-14 NOTE — ED Provider Notes (Signed)
Patient initially seen and evaluated by Dr. Patria Maneampos for left-sided abdominal pain and flank pain. He was sent for CT scan which has come back unremarkable. Patient is now describing left-sided abdominal pain radiating to the left gluteal areas suspicious for sciatic type pain. He is discharged with prescriptions for oxycodone have acetaminophen, naproxen, and orphenadrine and is referred to his orthopedic physician, [facility].  Results for orders placed or performed during the hospital encounter of 12/13/14  CBC with Differential/Platelet  Result Value Ref Range   WBC 13.8 (H) 4.0 - 10.5 K/uL   RBC 4.84 4.22 - 5.81 MIL/uL   Hemoglobin 15.9 13.0 - 17.0 g/dL   HCT 16.144.6 09.639.0 - 04.552.0 %   MCV 92.1 78.0 - 100.0 fL   MCH 32.9 26.0 - 34.0 pg   MCHC 35.7 30.0 - 36.0 g/dL   RDW 40.914.9 81.111.5 - 91.415.5 %   Platelets 297 150 - 400 K/uL   Neutrophils Relative % 71 43 - 77 %   Neutro Abs 9.8 (H) 1.7 - 7.7 K/uL   Lymphocytes Relative 15 12 - 46 %   Lymphs Abs 2.1 0.7 - 4.0 K/uL   Monocytes Relative 12 3 - 12 %   Monocytes Absolute 1.7 (H) 0.1 - 1.0 K/uL   Eosinophils Relative 2 0 - 5 %   Eosinophils Absolute 0.3 0.0 - 0.7 K/uL   Basophils Relative 0 0 - 1 %   Basophils Absolute 0.0 0.0 - 0.1 K/uL  Comprehensive metabolic panel  Result Value Ref Range   Sodium 136 135 - 145 mmol/L   Potassium 3.6 3.5 - 5.1 mmol/L   Chloride 103 96 - 112 mmol/L   CO2 28 19 - 32 mmol/L   Glucose, Bld 117 (H) 70 - 99 mg/dL   BUN 13 6 - 23 mg/dL   Creatinine, Ser 7.821.45 (H) 0.50 - 1.35 mg/dL   Calcium 8.8 8.4 - 95.610.5 mg/dL   Total Protein 6.3 6.0 - 8.3 g/dL   Albumin 3.7 3.5 - 5.2 g/dL   AST 46 (H) 0 - 37 U/L   ALT 30 0 - 53 U/L   Alkaline Phosphatase 43 39 - 117 U/L   Total Bilirubin 0.7 0.3 - 1.2 mg/dL   GFR calc non Af Amer 58 (L) >90 mL/min   GFR calc Af Amer 67 (L) >90 mL/min   Anion gap 5 5 - 15  Urinalysis, Routine w reflex microscopic  Result Value Ref Range   Color, Urine YELLOW YELLOW    APPearance CLEAR CLEAR   Specific Gravity, Urine 1.007 1.005 - 1.030   pH 6.0 5.0 - 8.0   Glucose, UA NEGATIVE NEGATIVE mg/dL   Hgb urine dipstick NEGATIVE NEGATIVE   Bilirubin Urine NEGATIVE NEGATIVE   Ketones, ur NEGATIVE NEGATIVE mg/dL   Protein, ur NEGATIVE NEGATIVE mg/dL   Urobilinogen, UA 0.2 0.0 - 1.0 mg/dL   Nitrite NEGATIVE NEGATIVE   Leukocytes, UA NEGATIVE NEGATIVE   Dg Chest 2 View  12/13/2014   CLINICAL DATA:  Nervous feeling. Shaking. Shortness of breath. Weakness and tingling after working out this evening.  EXAM: CHEST  2 VIEW  COMPARISON:  CT chest 12/01/2014  FINDINGS: The heart size and mediastinal contours are within normal limits. Both lungs are clear. The visualized skeletal structures are unremarkable.  IMPRESSION: No active cardiopulmonary disease.   Electronically Signed   By: Burman NievesWilliam  Stevens M.D.   On: 12/13/2014 22:44   Ct Chest Wo Contrast  12/01/2014   CLINICAL DATA:  One year follow-up pulmonary nodules  EXAM: CT CHEST WITHOUT CONTRAST  TECHNIQUE: Multidetector CT imaging of the chest was performed following the standard protocol without IV contrast.  COMPARISON:  11/30/2013  FINDINGS: Mediastinum/Nodes: Heart is normal in size. No pericardial effusion.  Very mild atherosclerotic calcifications of the aortic arch (series 2/ image 32).  No suspicious mediastinal or axillary lymphadenopathy.  Visualized thyroid is unremarkable.  Lungs/Pleura: Stable 3-4 mm subpleural lymph nodes in left upper lobe (series 3/ image 28) and left lower lobe (series 3/ image 47).  Stable 3 mm subpleural lymph node in the right lower lobe (series 3/ image 41).  No new/suspicious pulmonary nodules.  Mild paraseptal emphysematous changes.  No pleural effusion or pneumothorax.  Upper abdomen: Visualized upper abdomen is notable for a suspected punctate nonobstructing right upper pole renal calculus (series 2/ image 61).  Musculoskeletal: Visualized osseous structures are within normal limits.   IMPRESSION: Stable 3-4 mm subpleural nodules bilaterally, benign.  No new/suspicious pulmonary nodules.  12 month stability has been demonstrated. No dedicated follow-up imaging is required per Fleischner Society guidelines.   Electronically Signed   By: Charline Bills M.D.   On: 12/01/2014 16:23   Ct Abdomen Pelvis W Contrast  12/14/2014   CLINICAL DATA:  LEFT lower quadrant and back pain for a few days. Tired, shortness of breath, hand tingling with discoloration.  EXAM: CT ABDOMEN AND PELVIS WITH CONTRAST  TECHNIQUE: Multidetector CT imaging of the abdomen and pelvis was performed using the standard protocol following bolus administration of intravenous contrast.  CONTRAST:  OMNIPAQUE IOHEXOL 300 MG/ML  SOLN  COMPARISON:  CT of the abdomen and pelvis November 01, 2013  FINDINGS: LUNG BASES: Dependent atelectasis. 3 mm subpleural LEFT lower lobe pulmonary nodule, likely benign, and most apparent in prior imaging. Heart size is normal, no pericardial fluid collections.  SOLID ORGANS: The spleen, gallbladder, pancreas and adrenal glands are unremarkable. Tiny hypodensity LEFT lobe of the liver could reflect a cyst, the liver is otherwise unremarkable.  GASTROINTESTINAL TRACT: Minimum amount of contrast in the distal esophagus could be seen with reflux. The stomach, small and large bowel are normal in course and caliber without inflammatory changes. Normal appendix.  KIDNEYS/ URINARY TRACT: Kidneys are orthotopic, demonstrating symmetric enhancement. 2 mm RIGHT upper pole, 2 mm LEFT interpolar nephrolithiasis. Too small to characterize hypodensities RIGHT kidney. Additional punctate nephrolithiasis bilaterally. No hydronephrosis or solid renal masses. The unopacified ureters are normal in course and caliber. Delayed imaging through the kidneys demonstrates symmetric prompt contrast excretion within the proximal urinary collecting system. Urinary bladder is partially distended and unremarkable.   PERITONEUM/RETROPERITONEUM: Aortoiliac vessels are normal in course and caliber. No lymphadenopathy by CT size criteria. Internal reproductive organs are unremarkable. No intraperitoneal free fluid nor free air.  SOFT TISSUE/OSSEOUS STRUCTURES: Non-suspicious.  IMPRESSION: No acute intra-abdominal or pelvic process.  Bilateral nonobstructing nephrolithiasis measure up to 2 mm.   Electronically Signed   By: Awilda Metro   On: 12/14/2014 01:43      Dione Booze, MD 12/14/14 [phone]

## 2014-12-14 NOTE — ED Notes (Signed)
CT aware pt has finished drinking contrast

## 2014-12-14 NOTE — Discharge Instructions (Signed)
Back Pain, Adult °Low back pain is very common. About 1 in 5 people have back pain. The cause of low back pain is rarely dangerous. The pain often gets better over time. About half of people with a sudden onset of back pain feel better in just 2 weeks. About 8 in 10 people feel better by 6 weeks.  °CAUSES °Some common causes of back pain include: °· Strain of the muscles or ligaments supporting the spine. °· Wear and tear (degeneration) of the spinal discs. °· Arthritis. °· Direct injury to the back. °DIAGNOSIS °Most of the time, the direct cause of low back pain is not known. However, back pain can be treated effectively even when the exact cause of the pain is unknown. Answering your caregiver's questions about your overall health and symptoms is one of the most accurate ways to make sure the cause of your pain is not dangerous. If your caregiver needs more information, he or she may order lab work or imaging tests (X-rays or MRIs). However, even if imaging tests show changes in your back, this usually does not require surgery. °HOME CARE INSTRUCTIONS °For many people, back pain returns. Since low back pain is rarely dangerous, it is often a condition that people can learn to manage on their own.  °· Remain active. It is stressful on the back to sit or stand in one place. Do not sit, drive, or stand in one place for more than 30 minutes at a time. Take short walks on level surfaces as soon as pain allows. Try to increase the length of time you walk each day. °· Do not stay in bed. Resting more than 1 or 2 days can delay your recovery. °· Do not avoid exercise or work. Your body is made to move. It is not dangerous to be active, even though your back may hurt. Your back will likely heal faster if you return to being active before your pain is gone. °· Pay attention to your body when you  bend and lift. Many people have less discomfort when lifting if they bend their knees, keep the load close to their bodies, and  avoid twisting. Often, the most comfortable positions are those that put less stress on your recovering back. °· Find a comfortable position to sleep. Use a firm mattress and lie on your side with your knees slightly bent. If you lie on your back, put a pillow under your knees. °· Only take over-the-counter or prescription medicines as directed by your caregiver. Over-the-counter medicines to reduce pain and inflammation are often the most helpful. Your caregiver may prescribe muscle relaxant drugs. These medicines help dull your pain so you can more quickly return to your normal activities and healthy exercise. °· Put ice on the injured area. °¨ Put ice in a plastic bag. °¨ Place a towel between your skin and the bag. °¨ Leave the ice on for 15-20 minutes, 03-04 times a day for the first 2 to 3 days. After that, ice and heat may be alternated to reduce pain and spasms. °· Ask your caregiver about trying back exercises and gentle massage. This may be of some benefit. °· Avoid feeling anxious or stressed. Stress increases muscle tension and can worsen back pain. It is important to recognize when you are anxious or stressed and learn ways to manage it. Exercise is a great option. °SEEK MEDICAL CARE IF: °· You have pain that is not relieved with rest or medicine. °· You have pain that does not improve in 1 week. °· You have new symptoms. °· You are generally not feeling well. °SEEK   IMMEDIATE MEDICAL CARE IF:  °· You have pain that radiates from your back into your legs. °· You develop new bowel or bladder control problems. °· You have unusual weakness or numbness in your arms or legs. °· You develop nausea or vomiting. °· You develop abdominal pain. °· You feel faint. °Document Released: 10/20/2005 Document Revised: 04/20/2012 Document Reviewed: 02/21/2014 °[employer]® Patient Information ©2015 [employer]. This information is not intended to replace advice given to you by your health care provider. Make sure you  discuss any questions you have with your health care provider. ° °Naproxen and naproxen sodium oral immediate-release tablets °What is this medicine? °NAPROXEN (na PROX en) is a non-steroidal anti-inflammatory drug (NSAID). It is used to reduce swelling and to treat pain. This medicine may be used for dental pain, headache, or painful monthly periods. It is also used for painful joint and muscular problems such as arthritis, tendinitis, bursitis, and gout. °This medicine may be used for other purposes; ask your health care provider or pharmacist if you have questions. °COMMON BRAND NAME(S): Aflaxen, Aleve, Aleve Arthritis, All Day Relief, Anaprox, Anaprox DS, Naprosyn °What should I tell my health care provider before I take this medicine? °They need to know if you have any of these conditions: °-asthma °-cigarette smoker °-drink more than 3 alcohol containing drinks a day °-heart disease or circulation problems such as heart failure or leg edema (fluid retention) °-high blood pressure °-kidney disease °-liver disease °-stomach bleeding or ulcers °-an unusual or allergic reaction to naproxen, aspirin, other NSAIDs, other medicines, foods, dyes, or preservatives °-pregnant or trying to get pregnant °-breast-feeding °How should I use this medicine? °Take this medicine by mouth with a glass of water. Follow the directions on the prescription label. Take it with food if your stomach gets upset. Try to not lie down for at least 10 minutes after you take it. Take your medicine at regular intervals. Do not take your medicine more often than directed. Long-term, continuous use may increase the risk of heart attack or stroke. °A special MedGuide will be given to you by the pharmacist with each prescription and refill. Be sure to read this information carefully each time. °Talk to your pediatrician regarding the use of this medicine in children. Special care may be needed. °Overdosage: If you think you have taken too much of  this medicine contact a poison control center or emergency room at once. °NOTE: This medicine is only for you. Do not share this medicine with others. °What if I miss a dose? °If you miss a dose, take it as soon as you can. If it is almost time for your next dose, take only that dose. Do not take double or extra doses. °What may interact with this medicine? °-alcohol °-aspirin °-cidofovir °-diuretics °-lithium °-methotrexate °-other drugs for inflammation like ketorolac or prednisone °-pemetrexed °-probenecid °-warfarin °This list may not describe all possible interactions. Give your health care provider a list of all the medicines, herbs, non-prescription drugs, or dietary supplements you use. Also tell them if you smoke, drink alcohol, or use illegal drugs. Some items may interact with your medicine. °What should I watch for while using this medicine? °Tell your doctor or health care professional if your pain does not get better. Talk to your doctor before taking another medicine for pain. Do not treat yourself. °This medicine does not prevent heart attack or stroke. In fact, this medicine may increase the chance of a heart attack or stroke. The chance may increase   with longer use of this medicine and in people who have heart disease. If you take aspirin to prevent heart attack or stroke, talk with your doctor or health care professional. °Do not take other medicines that contain aspirin, ibuprofen, or naproxen with this medicine. Side effects such as stomach upset, nausea, or ulcers may be more likely to occur. Many medicines available without a prescription should not be taken with this medicine. °This medicine can cause ulcers and bleeding in the stomach and intestines at any time during treatment. Do not smoke cigarettes or drink alcohol. These increase irritation to your stomach and can make it more susceptible to damage from this medicine. Ulcers and bleeding can happen without warning symptoms and can cause  death. °You may get drowsy or dizzy. Do not drive, use machinery, or do anything that needs mental alertness until you know how this medicine affects you. Do not stand or sit up quickly, especially if you are an older patient. This reduces the risk of dizzy or fainting spells. °This medicine can cause you to bleed more easily. Try to avoid damage to your teeth and gums when you brush or floss your teeth. °What side effects may I notice from receiving this medicine? °Side effects that you should report to your doctor or health care professional as soon as possible: °-black or bloody stools, blood in the urine or vomit °-blurred vision °-chest pain °-difficulty breathing or wheezing °-nausea or vomiting °-severe stomach pain °-skin rash, skin redness, blistering or peeling skin, hives, or itching °-slurred speech or weakness on one side of the body °-swelling of eyelids, throat, lips °-unexplained weight gain or swelling °-unusually weak or tired °-yellowing of eyes or skin °Side effects that usually do not require medical attention (report to your doctor or health care professional if they continue or are bothersome): °-constipation °-headache °-heartburn °This list may not describe all possible side effects. Call your doctor for medical advice about side effects. You may report side effects to FDA at 1-800-FDA-1088. °Where should I keep my medicine? °Keep out of the reach of children. °Store at room temperature between 15 and 30 degrees C (59 and 86 degrees F). Keep container tightly closed. Throw away any unused medicine after the expiration date. °NOTE: This sheet is a summary. It may not cover all possible information. If you have questions about this medicine, talk to your doctor, pharmacist, or health care provider. °© 2015, [employer]. (2009-10-22 20:10:16) ° °Orphenadrine tablets °What is this medicine? °ORPHENADRINE (or FEN a dreen) helps to relieve pain and stiffness in muscles and can treat  muscle spasms. °This medicine may be used for other purposes; ask your health care provider or pharmacist if you have questions. °COMMON BRAND NAME(S): Norflex °What should I tell my health care provider before I take this medicine? °They need to know if you have any of these conditions: °-glaucoma °-heart disease °-kidney disease °-myasthenia gravis °-peptic ulcer disease °-prostate disease °-stomach problems °-an unusual or allergic reaction to orphenadrine, other medicines, foods, lactose, dyes, or preservatives °-pregnant or trying to get pregnant °-breast-feeding °How should I use this medicine? °Take this medicine by mouth with a full glass of water. Follow the directions on the prescription label. Take your medicine at regular intervals. Do not take your medicine more often than directed. Do not take more than you are told to take. °Talk to your pediatrician regarding the use of this medicine in children. Special care may be needed. °Patients over 65 years old   may have a stronger reaction and need a smaller dose. °Overdosage: If you think you have taken too much of this medicine contact a poison control center or emergency room at once. °NOTE: This medicine is only for you. Do not share this medicine with others. °What if I miss a dose? °If you miss a dose, take it as soon as you can. If it is almost time for your next dose, take only that dose. Do not take double or extra doses. °What may interact with this medicine? °-alcohol °-antihistamines °-barbiturates, like phenobarbital °-benzodiazepines °-cyclobenzaprine °-medicines for pain °-phenothiazines like chlorpromazine, mesoridazine, prochlorperazine, thioridazine °This list may not describe all possible interactions. Give your health care provider a list of all the medicines, herbs, non-prescription drugs, or dietary supplements you use. Also tell them if you smoke, drink alcohol, or use illegal drugs. Some items may interact with your medicine. °What  should I watch for while using this medicine? °Your mouth may get dry. Chewing sugarless gum or sucking hard candy, and drinking plenty of water may help. Contact your doctor if the problem does not go away or is severe. °This medicine may cause dry eyes and blurred vision. If you wear contact lenses you may feel some discomfort. Lubricating drops may help. See your eye doctor if the problem does not go away or is severe. °You may get drowsy or dizzy. Do not drive, use machinery, or do anything that needs mental alertness until you know how this medicine affects you. Do not stand or sit up quickly, especially if you are an older patient. This reduces the risk of dizzy or fainting spells. Alcohol may interfere with the effect of this medicine. Avoid alcoholic drinks. °What side effects may I notice from receiving this medicine? °Side effects that you should report to your doctor or health care professional as soon as possible: °-allergic reactions like skin rash, itching or hives, swelling of the face, lips, or tongue °-changes in vision °-difficulty breathing °-fast heartbeat or palpitations °-hallucinations °-light headedness, fainting spells °-vomiting °Side effects that usually do not require medical attention (report to your doctor or health care professional if they continue or are bothersome): °-dizziness °-drowsiness °-headache °-nausea °This list may not describe all possible side effects. Call your doctor for medical advice about side effects. You may report side effects to FDA at 1-800-FDA-1088. °Where should I keep my medicine? °Keep out of the reach of children. °Store at room temperature between 15 and 30 degrees C (59 and 86 degrees F). Protect from light. Keep container tightly closed. Throw away any unused medicine after the expiration date. °NOTE: This sheet is a summary. It may not cover all possible information. If you have questions about this medicine, talk to your doctor, pharmacist, or health  care provider. °© 2015, [employer]. (2008-05-16 17:19:12) ° °Acetaminophen; Oxycodone tablets °What is this medicine? °ACETAMINOPHEN; OXYCODONE (a set a MEE noe fen; ox i KOE done) is a pain reliever. It is used to treat mild to moderate pain. °This medicine may be used for other purposes; ask your health care provider or pharmacist if you have questions. °COMMON BRAND NAME(S): Endocet, Magnacet, Narvox, Percocet, Perloxx, Primalev, Primlev, Roxicet, Xolox °What should I tell my health care provider before I take this medicine? °They need to know if you have any of these conditions: °-brain tumor °-Crohn's disease, inflammatory bowel disease, or ulcerative colitis °-drug abuse or addiction °-head injury °-heart or circulation problems °-if you often drink alcohol °-kidney disease or problems   going to the bathroom °-liver disease °-lung disease, asthma, or breathing problems °-an unusual or allergic reaction to acetaminophen, oxycodone, other opioid analgesics, other medicines, foods, dyes, or preservatives °-pregnant or trying to get pregnant °-breast-feeding °How should I use this medicine? °Take this medicine by mouth with a full glass of water. Follow the directions on the prescription label. Take your medicine at regular intervals. Do not take your medicine more often than directed. °Talk to your pediatrician regarding the use of this medicine in children. Special care may be needed. °Patients over 65 years old may have a stronger reaction and need a smaller dose. °Overdosage: If you think you have taken too much of this medicine contact a poison control center or emergency room at once. °NOTE: This medicine is only for you. Do not share this medicine with others. °What if I miss a dose? °If you miss a dose, take it as soon as you can. If it is almost time for your next dose, take only that dose. Do not take double or extra doses. °What may interact with this  medicine? °-alcohol °-antihistamines °-barbiturates like amobarbital, butalbital, butabarbital, methohexital, pentobarbital, phenobarbital, thiopental, and secobarbital °-benztropine °-drugs for bladder problems like solifenacin, trospium, oxybutynin, tolterodine, hyoscyamine, and methscopolamine °-drugs for breathing problems like ipratropium and tiotropium °-drugs for certain stomach or intestine problems like propantheline, homatropine methylbromide, glycopyrrolate, atropine, belladonna, and dicyclomine °-general anesthetics like etomidate, ketamine, nitrous oxide, propofol, desflurane, enflurane, halothane, isoflurane, and sevoflurane °-medicines for depression, anxiety, or psychotic disturbances °-medicines for sleep °-muscle relaxants °-naltrexone °-narcotic medicines (opiates) for pain °-phenothiazines like perphenazine, thioridazine, chlorpromazine, mesoridazine, fluphenazine, prochlorperazine, promazine, and trifluoperazine °-scopolamine °-tramadol °-trihexyphenidyl °This list may not describe all possible interactions. Give your health care provider a list of all the medicines, herbs, non-prescription drugs, or dietary supplements you use. Also tell them if you smoke, drink alcohol, or use illegal drugs. Some items may interact with your medicine. °What should I watch for while using this medicine? °Tell your doctor or health care professional if your pain does not go away, if it gets worse, or if you have new or a different type of pain. You may develop tolerance to the medicine. Tolerance means that you will need a higher dose of the medication for pain relief. Tolerance is normal and is expected if you take this medicine for a long time. °Do not suddenly stop taking your medicine because you may develop a severe reaction. Your body becomes used to the medicine. This does NOT mean you are addicted. Addiction is a behavior related to getting and using a drug for a non-medical reason. If you have pain, you  have a medical reason to take pain medicine. Your doctor will tell you how much medicine to take. If your doctor wants you to stop the medicine, the dose will be slowly lowered over time to avoid any side effects. °You may get drowsy or dizzy. Do not drive, use machinery, or do anything that needs mental alertness until you know how this medicine affects you. Do not stand or sit up quickly, especially if you are an older patient. This reduces the risk of dizzy or fainting spells. Alcohol may interfere with the effect of this medicine. Avoid alcoholic drinks. °There are different types of narcotic medicines (opiates) for pain. If you take more than one type at the same time, you may have more side effects. Give your health care provider a list of all medicines you use. Your doctor will tell you how   much medicine to take. Do not take more medicine than directed. Call emergency for help if you have problems breathing. °The medicine will cause constipation. Try to have a bowel movement at least every 2 to 3 days. If you do not have a bowel movement for 3 days, call your doctor or health care professional. °Do not take Tylenol (acetaminophen) or medicines that have acetaminophen with this medicine. Too much acetaminophen can be very dangerous. Many nonprescription medicines contain acetaminophen. Always read the labels carefully to avoid taking more acetaminophen. °What side effects may I notice from receiving this medicine? °Side effects that you should report to your doctor or health care professional as soon as possible: °-allergic reactions like skin rash, itching or hives, swelling of the face, lips, or tongue °-breathing difficulties, wheezing °-confusion °-light headedness or fainting spells °-severe stomach pain °-unusually weak or tired °-yellowing of the skin or the whites of the eyes °Side effects that usually do not require medical attention (report to your doctor or health care professional if they continue  or are bothersome): °-dizziness °-drowsiness °-nausea °-vomiting °This list may not describe all possible side effects. Call your doctor for medical advice about side effects. You may report side effects to FDA at 1-800-FDA-1088. °Where should I keep my medicine? °Keep out of the reach of children. This medicine can be abused. Keep your medicine in a safe place to protect it from theft. Do not share this medicine with anyone. Selling or giving away this medicine is dangerous and against the law. °Store at room temperature between 20 and 25 degrees C (68 and 77 degrees F). Keep container tightly closed. Protect from light. °This medicine may cause accidental overdose and death if it is taken by other adults, children, or pets. Flush any unused medicine down the toilet to reduce the chance of harm. Do not use the medicine after the expiration date. °NOTE: This sheet is a summary. It may not cover all possible information. If you have questions about this medicine, talk to your doctor, pharmacist, or health care provider. °© 2015, [employer]. (2013-06-13 13:17:35) ° °

## 2015-09-20 IMAGING — CT CT ABD-PELV W/ CM
2 of 5 series · 11 of 46 positions shown, 12 images · IV contrast (Iodine)
Comparison: CT of the abdomen and pelvis November 01, 2013

CLINICAL DATA: LEFT lower quadrant and back pain for a few days.
Tired, shortness of breath, hand tingling with discoloration.

EXAM:
CT ABDOMEN AND PELVIS WITH CONTRAST
TECHNIQUE: Multidetector CT imaging of the abdomen and pelvis was performed
using the standard protocol following bolus administration of
intravenous contrast.
CONTRAST:  100mL OMNIPAQUE IOHEXOL 300 MG/ML  SOLN

[Series 201: routine, idose (2) · axial · 0.78mm/px · z∈[-482,-117]mm · 8 of 93 slices shown, 9 images]
[im 10/93  soft-tissue]
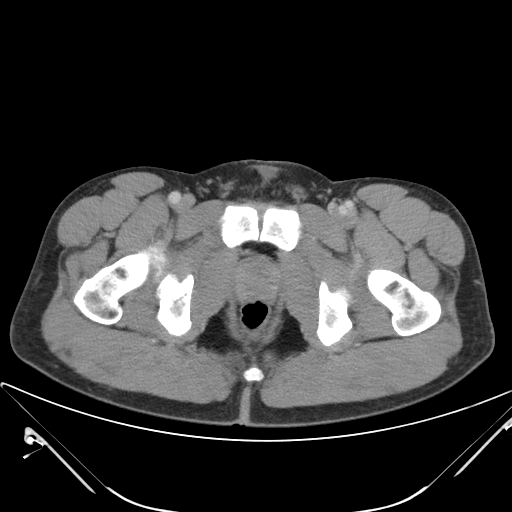
[im 10/93  bone]
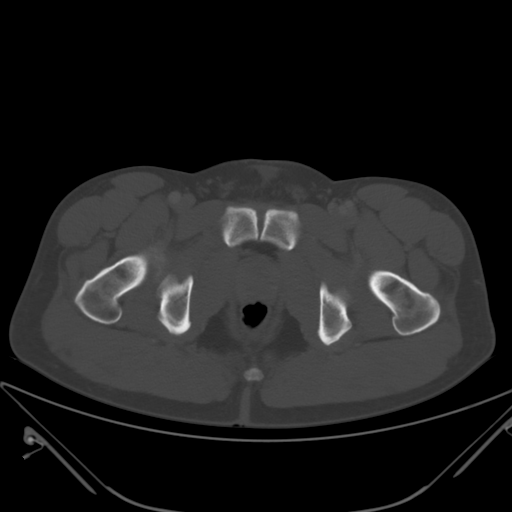
[im 19/93  soft-tissue]
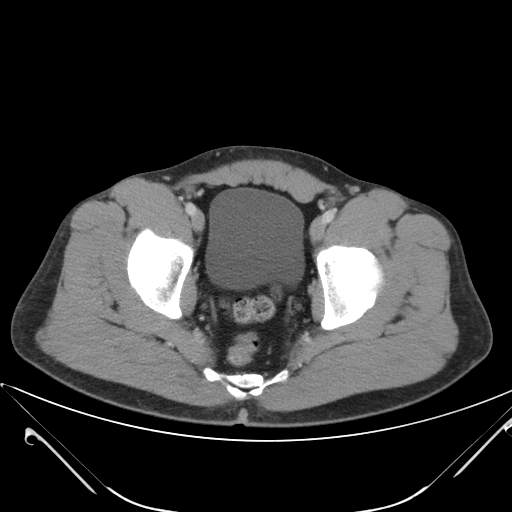
[im 28/93  soft-tissue]
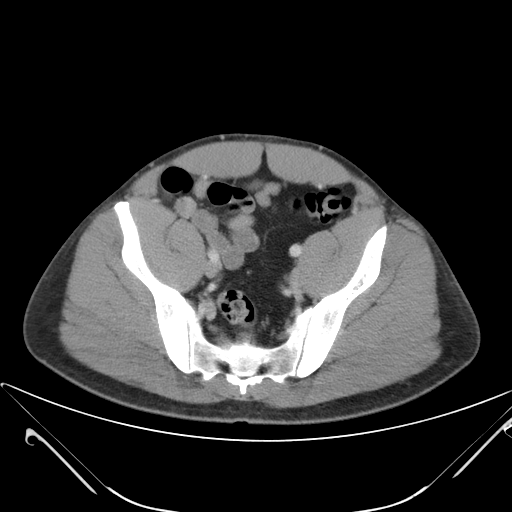
[im 42/93  soft-tissue]
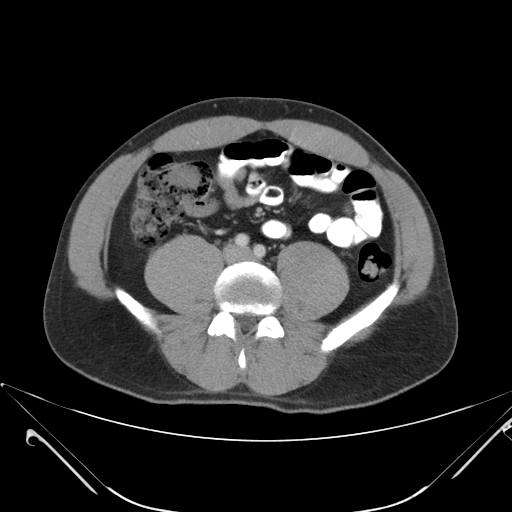
[im 51/93  soft-tissue]
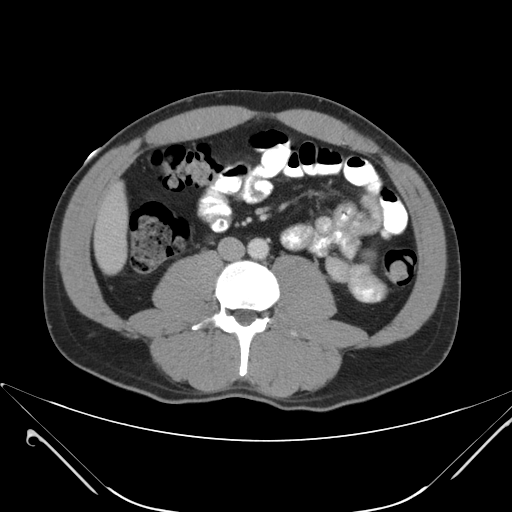
[im 65/93  soft-tissue]
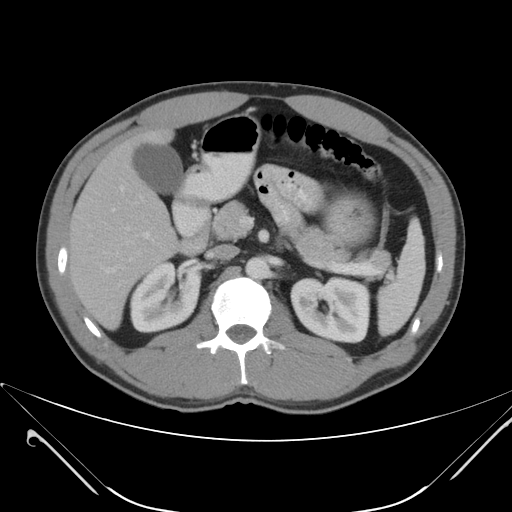
[im 74/93  soft-tissue]
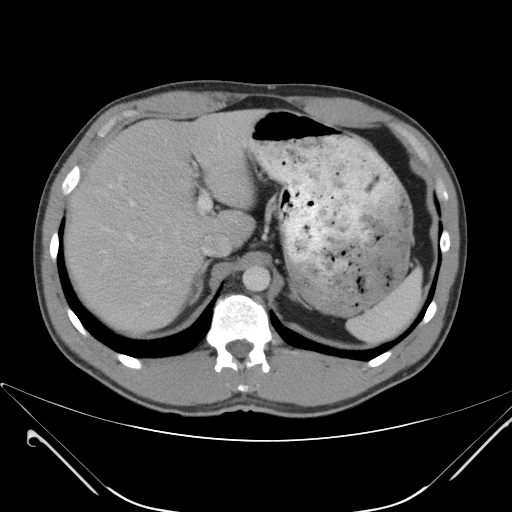
[im 83/93  soft-tissue]
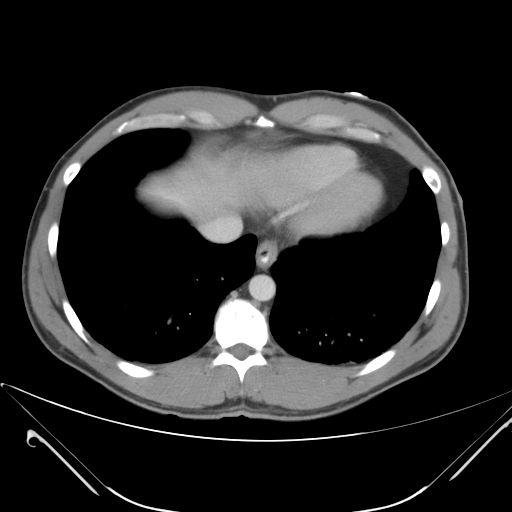

[Series 202: coronals, idose (2) · coronal · 0.45mm/px · 3 of 108 slices shown]
[im 36/108  soft-tissue]
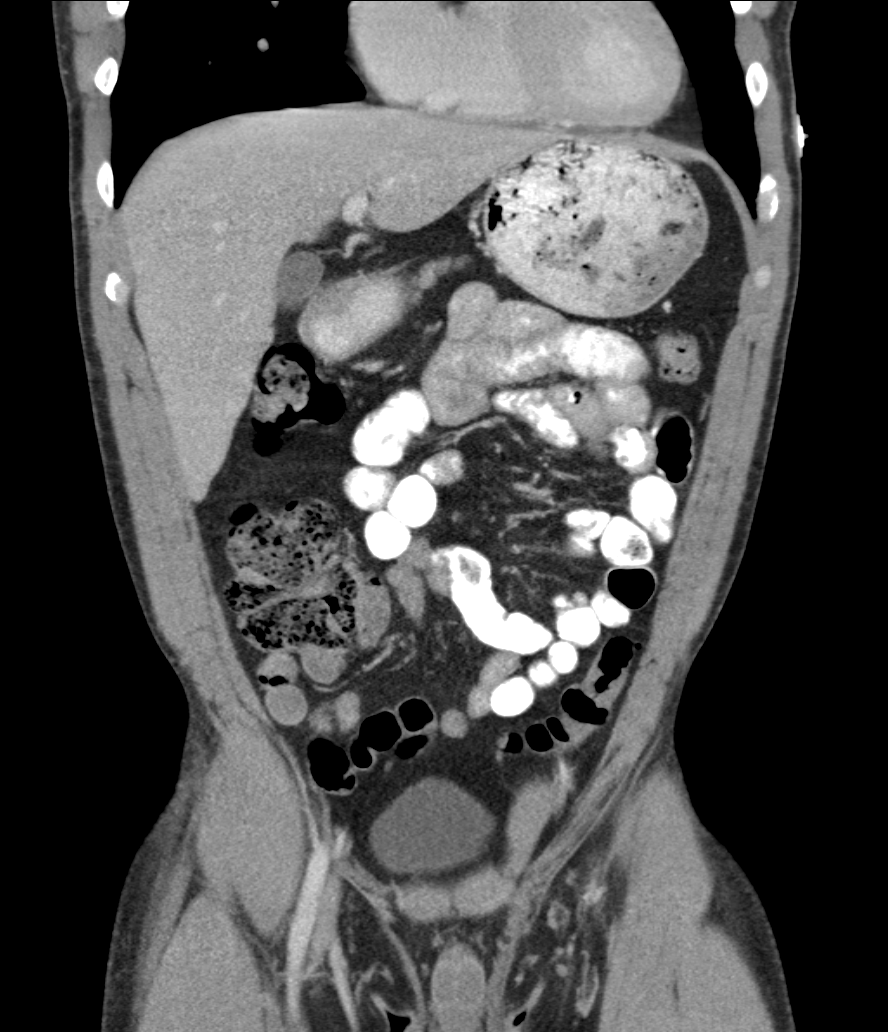
[im 48/108  soft-tissue]
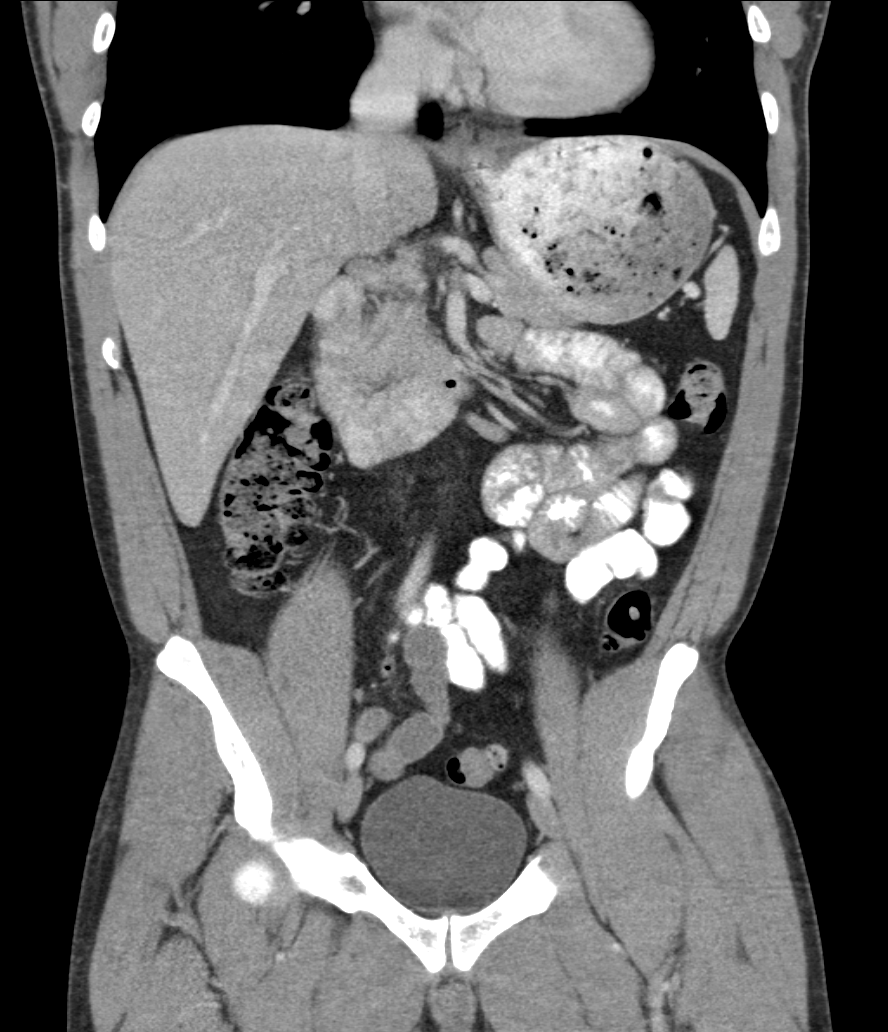
[im 60/108  soft-tissue]
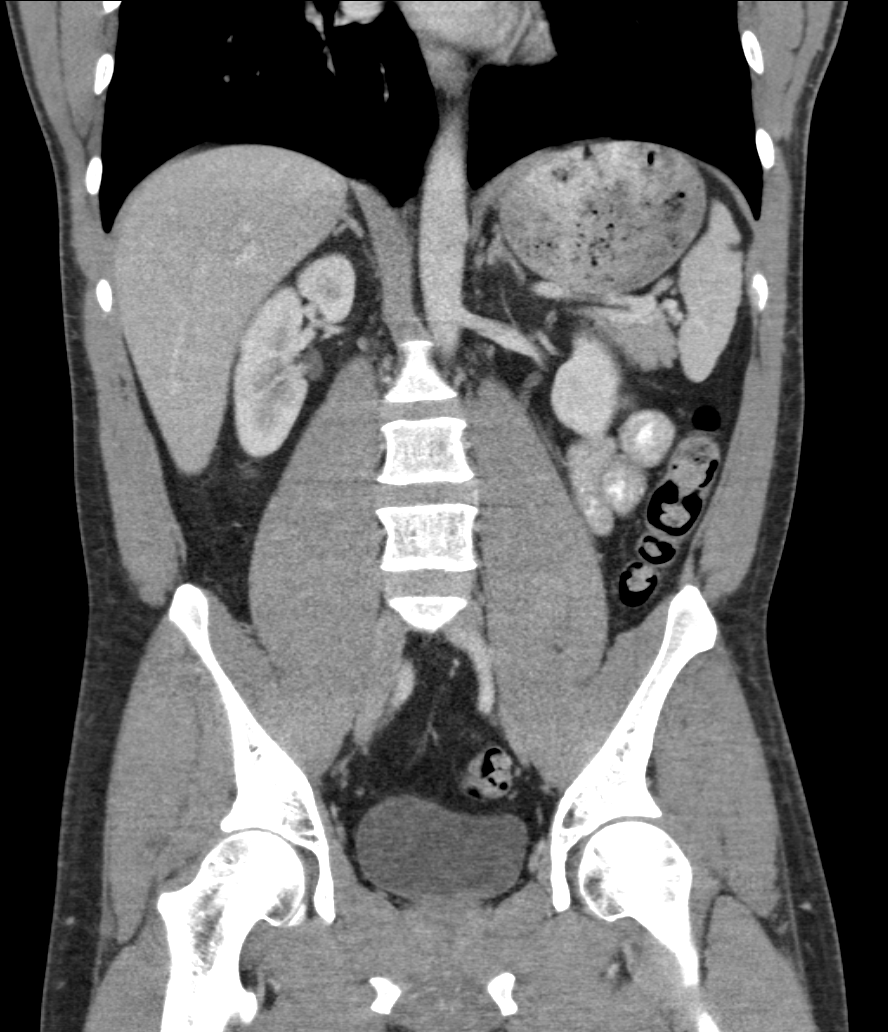

[11 of 46 positions shown; findings below may reference images not displayed]

FINDINGS: LUNG BASES: Dependent atelectasis. 3 mm subpleural LEFT lower lobe
pulmonary nodule, likely benign, and most apparent in prior imaging.
Heart size is normal, no pericardial fluid collections.

SOLID ORGANS: The spleen, gallbladder, pancreas and adrenal glands
are unremarkable. Tiny hypodensity LEFT lobe of the liver could
reflect a cyst, the liver is otherwise unremarkable.

GASTROINTESTINAL TRACT: Minimum amount of contrast in the distal
esophagus could be seen with reflux. The stomach, small and large
bowel are normal in course and caliber without inflammatory changes.
Normal appendix.

KIDNEYS/ URINARY TRACT: Kidneys are orthotopic, demonstrating
symmetric enhancement. 2 mm RIGHT upper pole, 2 mm LEFT interpolar
nephrolithiasis. Too small to characterize hypodensities RIGHT
kidney. Additional punctate nephrolithiasis bilaterally. No
hydronephrosis or solid renal masses. The unopacified ureters are
normal in course and caliber. Delayed imaging through the kidneys
demonstrates symmetric prompt contrast excretion within the proximal
urinary collecting system. Urinary bladder is partially distended
and unremarkable.

PERITONEUM/RETROPERITONEUM: Aortoiliac vessels are normal in course
and caliber. No lymphadenopathy by CT size criteria. Internal
reproductive organs are unremarkable. No intraperitoneal free fluid
nor free air.

SOFT TISSUE/OSSEOUS STRUCTURES: Non-suspicious.
IMPRESSION: No acute intra-abdominal or pelvic process.

Bilateral nonobstructing nephrolithiasis measure up to 2 mm.

  By: [employer] Murcia Zelaya

## 2021-12-22 DIAGNOSIS — M545 Low back pain, unspecified: Secondary | ICD-10-CM | POA: Diagnosis not present

## 2021-12-23 DIAGNOSIS — J0100 Acute maxillary sinusitis, unspecified: Secondary | ICD-10-CM | POA: Diagnosis not present

## 2021-12-23 DIAGNOSIS — U071 COVID-19: Secondary | ICD-10-CM | POA: Diagnosis not present

## 2021-12-23 DIAGNOSIS — R059 Cough, unspecified: Secondary | ICD-10-CM | POA: Diagnosis not present

## 2022-10-21 DIAGNOSIS — J0101 Acute recurrent maxillary sinusitis: Secondary | ICD-10-CM | POA: Diagnosis not present

## 2022-11-24 DIAGNOSIS — R070 Pain in throat: Secondary | ICD-10-CM | POA: Diagnosis not present

## 2022-11-26 DIAGNOSIS — R051 Acute cough: Secondary | ICD-10-CM | POA: Diagnosis not present

## 2022-11-26 DIAGNOSIS — J0100 Acute maxillary sinusitis, unspecified: Secondary | ICD-10-CM | POA: Diagnosis not present

## 2022-11-27 DIAGNOSIS — K219 Gastro-esophageal reflux disease without esophagitis: Secondary | ICD-10-CM | POA: Diagnosis not present

## 2022-11-27 DIAGNOSIS — Z800 Family history of malignant neoplasm of digestive organs: Secondary | ICD-10-CM | POA: Diagnosis not present

## 2022-11-27 DIAGNOSIS — R1319 Other dysphagia: Secondary | ICD-10-CM | POA: Diagnosis not present

## 2023-03-05 DIAGNOSIS — Z800 Family history of malignant neoplasm of digestive organs: Secondary | ICD-10-CM | POA: Diagnosis not present

## 2023-03-05 DIAGNOSIS — Z1211 Encounter for screening for malignant neoplasm of colon: Secondary | ICD-10-CM | POA: Diagnosis not present

## 2023-03-05 DIAGNOSIS — K222 Esophageal obstruction: Secondary | ICD-10-CM | POA: Diagnosis not present

## 2023-03-05 DIAGNOSIS — D122 Benign neoplasm of ascending colon: Secondary | ICD-10-CM | POA: Diagnosis not present

## 2023-03-05 DIAGNOSIS — D125 Benign neoplasm of sigmoid colon: Secondary | ICD-10-CM | POA: Diagnosis not present

## 2023-03-05 DIAGNOSIS — R1319 Other dysphagia: Secondary | ICD-10-CM | POA: Diagnosis not present

## 2023-03-10 DIAGNOSIS — R051 Acute cough: Secondary | ICD-10-CM | POA: Diagnosis not present

## 2023-04-24 DIAGNOSIS — J0100 Acute maxillary sinusitis, unspecified: Secondary | ICD-10-CM | POA: Diagnosis not present

## 2023-06-05 DIAGNOSIS — J0100 Acute maxillary sinusitis, unspecified: Secondary | ICD-10-CM | POA: Diagnosis not present

## 2023-09-03 DIAGNOSIS — J188 Other pneumonia, unspecified organism: Secondary | ICD-10-CM | POA: Diagnosis not present

## 2023-09-07 DIAGNOSIS — B86 Scabies: Secondary | ICD-10-CM | POA: Diagnosis not present

## 2023-11-05 DIAGNOSIS — J0100 Acute maxillary sinusitis, unspecified: Secondary | ICD-10-CM | POA: Diagnosis not present

## 2024-01-28 DIAGNOSIS — R1319 Other dysphagia: Secondary | ICD-10-CM | POA: Diagnosis not present

## 2024-01-28 DIAGNOSIS — Z800 Family history of malignant neoplasm of digestive organs: Secondary | ICD-10-CM | POA: Diagnosis not present

## 2024-01-28 DIAGNOSIS — K219 Gastro-esophageal reflux disease without esophagitis: Secondary | ICD-10-CM | POA: Diagnosis not present

## 2024-03-14 DIAGNOSIS — J0140 Acute pansinusitis, unspecified: Secondary | ICD-10-CM | POA: Diagnosis not present

## 2024-03-22 DIAGNOSIS — F17200 Nicotine dependence, unspecified, uncomplicated: Secondary | ICD-10-CM | POA: Diagnosis not present

## 2024-03-22 DIAGNOSIS — J0101 Acute recurrent maxillary sinusitis: Secondary | ICD-10-CM | POA: Diagnosis not present

## 2024-04-04 DIAGNOSIS — K2950 Unspecified chronic gastritis without bleeding: Secondary | ICD-10-CM | POA: Diagnosis not present

## 2024-04-04 DIAGNOSIS — R1319 Other dysphagia: Secondary | ICD-10-CM | POA: Diagnosis not present

## 2024-04-04 DIAGNOSIS — K259 Gastric ulcer, unspecified as acute or chronic, without hemorrhage or perforation: Secondary | ICD-10-CM | POA: Diagnosis not present

## 2024-04-04 DIAGNOSIS — K222 Esophageal obstruction: Secondary | ICD-10-CM | POA: Diagnosis not present

## 2024-04-04 DIAGNOSIS — K2100 Gastro-esophageal reflux disease with esophagitis, without bleeding: Secondary | ICD-10-CM | POA: Diagnosis not present

## 2024-06-02 DIAGNOSIS — J0100 Acute maxillary sinusitis, unspecified: Secondary | ICD-10-CM | POA: Diagnosis not present

## 2024-09-07 DIAGNOSIS — S29019A Strain of muscle and tendon of unspecified wall of thorax, initial encounter: Secondary | ICD-10-CM | POA: Diagnosis not present

## 2024-09-07 DIAGNOSIS — M47812 Spondylosis without myelopathy or radiculopathy, cervical region: Secondary | ICD-10-CM | POA: Diagnosis not present
